# Patient Record
Sex: Male | Born: 1990 | Race: White | Hispanic: No | Marital: Single | Smoking: Never smoker
Health system: Southern US, Community
[De-identification: ages and names within clinical notes are randomized; demographics above are authoritative.]

---

## 2020-06-13 ENCOUNTER — Emergency Department (HOSPITAL_BASED_OUTPATIENT_CLINIC_OR_DEPARTMENT_OTHER)
Admission: EM | Admit: 2020-06-13 | Discharge: 2020-06-13 | Disposition: A | Discharge status: Home / Self Care | Payer: No Typology Code available for payment source | Attending: Emergency Medicine | Admitting: Emergency Medicine

## 2020-06-13 ENCOUNTER — Other Ambulatory Visit: Payer: Self-pay

## 2020-06-13 ENCOUNTER — Encounter (HOSPITAL_BASED_OUTPATIENT_CLINIC_OR_DEPARTMENT_OTHER): Payer: Self-pay

## 2020-06-13 ENCOUNTER — Emergency Department (HOSPITAL_BASED_OUTPATIENT_CLINIC_OR_DEPARTMENT_OTHER): Payer: No Typology Code available for payment source

## 2020-06-13 DIAGNOSIS — X501XXS Overexertion from prolonged static or awkward postures, sequela: Secondary | ICD-10-CM | POA: Diagnosis not present

## 2020-06-13 DIAGNOSIS — Y9301 Activity, walking, marching and hiking: Secondary | ICD-10-CM | POA: Diagnosis not present

## 2020-06-13 DIAGNOSIS — S93491S Sprain of other ligament of right ankle, sequela: Secondary | ICD-10-CM | POA: Insufficient documentation

## 2020-06-13 DIAGNOSIS — S99911S Unspecified injury of right ankle, sequela: Secondary | ICD-10-CM | POA: Diagnosis present

## 2020-06-13 DIAGNOSIS — S93401S Sprain of unspecified ligament of right ankle, sequela: Secondary | ICD-10-CM

## 2020-06-13 NOTE — ED Notes (Addendum)
States was hiking this weekend when tripped and twisted ankle inward into outer aspect of ankle.  Noted large amount of swelling and bruising to outer ankle ans top of foot.  Pedal pulse present.  Cap refill to toes less than 3 sec.  Denies numbness to toes.

## 2020-06-13 NOTE — ED Provider Notes (Signed)
He will [facility] EMERGENCY DEPT Provider Note   CSN: [id number] Arrival date & time: 06/13/20  2121     History Chief Complaint  Patient presents with  . Ankle Injury    Jonathan Li is a 30 y.o. male presenting emergency department with a right ankle injury.  The patient ports that he was hiking 3 days ago and slipped on the side of and rolled his right ankle.  He was able to bear weight lightly with support afterwards.  He reports he has had persistent swelling of the right ankle and right foot since then.  There is discoloration of the ankle and foot as well.  He has been taking Motrin for pain  HPI     History reviewed. No pertinent past medical history.  There are no problems to display for this patient.   History reviewed. No pertinent surgical history.     No family history on file.  Social History   Tobacco Use  . Smoking status: Never Smoker  . Smokeless tobacco: Never Used  Vaping Use  . Vaping Use: Never used  Substance Use Topics  . Alcohol use: Yes    Comment: Socially   . Drug use: Never    Home Medications Prior to Admission medications   Not on File    Allergies    Amoxicillin  Review of Systems   Review of Systems  Musculoskeletal: Positive for arthralgias, gait problem and myalgias.  Skin: Positive for color change and rash.  Neurological: Negative for weakness and numbness.    Physical Exam Updated Vital Signs BP (!) 145/95 (BP Location: Right Arm)   Pulse (!) 55   Temp 98.3 F (36.8 C) (Oral)   Resp 18   Ht 6\' 1"  (1.854 m)   Wt 82.3 kg   SpO2 100%   BMI 23.94 kg/m   Physical Exam Constitutional:      General: He is not in acute distress. HENT:     Head: Normocephalic and atraumatic.  Eyes:     Conjunctiva/sclera: Conjunctivae normal.     Pupils: Pupils are equal, round, and reactive to light.  Cardiovascular:     Rate and Rhythm: Normal rate and regular rhythm.  Pulmonary:     Effort: Pulmonary  effort is normal. No respiratory distress.  Musculoskeletal:     Comments: No posterior tenderness of the lateral or medial malleoli No tenderness to palpation at the base of the 5th metatarsal No tenderness of the midfoot near navicula  Patient able to bear light weight in ED, but with difficulty Ecchymosis of the right ankle and foot, with edema of the foot   Skin:    General: Skin is warm and dry.  Neurological:     General: No focal deficit present.     Mental Status: He is alert. Mental status is at baseline.     Sensory: No sensory deficit.     ED Results / Procedures / Treatments   Labs (all labs ordered are listed, but only abnormal results are displayed) Labs Reviewed - No data to display  EKG None  Radiology DG Ankle Complete Right  Result Date: 06/13/2020 CLINICAL DATA:  Twisted ankle while hiking 3 days ago. EXAM: RIGHT ANKLE - COMPLETE 3+ VIEW COMPARISON:  None. FINDINGS: Lateral soft tissue swelling. No acute bony abnormality. Specifically, no fracture, subluxation, or dislocation. IMPRESSION: No acute bony abnormality. Electronically Signed   By: 08/13/2020 M.D.   On: 06/13/2020 22:14    Procedures  Procedures   Medications Ordered in ED Medications - No data to display  ED Course  I have reviewed the triage vital signs and the nursing notes.  Pertinent labs & imaging results that were available during my care of the patient were reviewed by me and considered in my medical decision making (see chart for details).  30 yo male here with right ankle injury after rolling/inversion while hiking 3 days ago.  He is neurovascularly intact Xray films show no evident ankle fracture.  He has no midfoot tenderness to suggest lisfranc fx, no isolated 5th toe ttp to suggest jones fx.  He does have ecchymosis of the right ankle and foot suggesting there may be a ligament or muscle tear.  I cannot elicit any significant tenderness on my exam, and he maintains full ROM of  the right ankle.  Given the degree of swelling and bruising, I advised we put him in a CAM boot and that he f/u with ortho if he remains unable to bear weight in 1 week.  Advised motrin and ICE at home.     Final Clinical Impression(s) / ED Diagnoses Final diagnoses:  Sprain of right ankle, unspecified ligament, sequela    Rx / DC Orders ED Discharge Orders    None       Terald Sleeper, MD 06/14/20 1014

## 2020-06-13 NOTE — Discharge Instructions (Addendum)
You can put light weight on your right foot, but try to use the cam boot whenever walking.  If you are still having significant pain 7 days after injury, or you cannot put full weight on your foot, please make an appointment with an orthopedist.  Continue to elevate your foot at home, use ice as needed (10 minutes at a time).  The swelling should gradually go down over the next 2 weeks.  The discoloration is likely due to blood or bleeding related to a tendon or muscle tear or injury.

## 2020-06-13 NOTE — ED Triage Notes (Addendum)
Pt states backpacking over weekend hiking on an incline and twisted right ankle Sunday. Patient wearing ASO from home.

## 2020-07-10 ENCOUNTER — Encounter (HOSPITAL_BASED_OUTPATIENT_CLINIC_OR_DEPARTMENT_OTHER): Payer: Self-pay | Admitting: Nurse Practitioner

## 2020-07-10 ENCOUNTER — Other Ambulatory Visit: Payer: Self-pay

## 2020-07-10 ENCOUNTER — Ambulatory Visit (INDEPENDENT_AMBULATORY_CARE_PROVIDER_SITE_OTHER): Payer: No Typology Code available for payment source | Admitting: Nurse Practitioner

## 2020-07-10 VITALS — BP 111/75 | HR 51 | Ht 72.5 in | Wt 180.8 lb

## 2020-07-10 DIAGNOSIS — Z0000 Encounter for general adult medical examination without abnormal findings: Secondary | ICD-10-CM | POA: Insufficient documentation

## 2020-07-10 DIAGNOSIS — Z1321 Encounter for screening for nutritional disorder: Secondary | ICD-10-CM

## 2020-07-10 DIAGNOSIS — Z1329 Encounter for screening for other suspected endocrine disorder: Secondary | ICD-10-CM

## 2020-07-10 DIAGNOSIS — S93401D Sprain of unspecified ligament of right ankle, subsequent encounter: Secondary | ICD-10-CM

## 2020-07-10 DIAGNOSIS — Z13220 Encounter for screening for lipoid disorders: Secondary | ICD-10-CM

## 2020-07-10 DIAGNOSIS — Z13228 Encounter for screening for other metabolic disorders: Secondary | ICD-10-CM

## 2020-07-10 DIAGNOSIS — Z130 Encounter for screening for diseases of the blood and blood-forming organs and certain disorders involving the immune mechanism: Secondary | ICD-10-CM

## 2020-07-10 DIAGNOSIS — S93401A Sprain of unspecified ligament of right ankle, initial encounter: Secondary | ICD-10-CM | POA: Insufficient documentation

## 2020-07-10 NOTE — Assessment & Plan Note (Signed)
CPE today with labs- no abnormality noted with exception of right ankle sprain.  Review of current and past medical history, social history, medication, and family history.  Review of care gaps and health maintenance recommendations.  Records from recent providers to be requested if not available in Chart Review or Care Everywhere.  Recommendations for health maintenance, diet, and exercise provided.  UTD on recommended screenings and vaccinations Will make changes to plan of care as necessary based on lab results.  F/U in 1 year or sooner if needed

## 2020-07-10 NOTE — Assessment & Plan Note (Signed)
Appears to be healing well with no signs of re-injury, limited ROM, or weakness present.  Symptoms consistent with healing inversion sprain of severe nature.  Associated pain on plantar surface of foot to great toe indicate possible plantar fascitis. Recommend continued exercises- handout provided Wear brace for support with activity Ice for edema after activity if noted May use ibuprofen or pain and inflammation Steadily increase activity as tolerated- avoid activities causing increased pain If no improvement in 2 weeks, will consider x-ray for evaluation of possible healing stress fracture given the significance of the ecchymosis.

## 2020-07-10 NOTE — Patient Instructions (Signed)
Recommendations from today's visit: I recommend continued stretching exercises on the ankle- I would wear the brace with activity for at least another 2 weeks to help with stabilization. If it gets any worse or not any better, let us know.  Avoid walking barefoot- this can make the heel pain worse We will let you know if your labs need any further attention once they result.   Information on diet, exercise, and health maintenance recommendations are listed below. This is information to help you be sure you are on track for optimal health and monitoring.   Please look over this and let us know if you have any questions or if you have completed any of the health maintenance outside of [place] so that we can be sure your records are up to date.  ___________________________________________________________  Thank you for choosing [facility] for your Primary Care needs. I am excited for the opportunity to partner with you to meet your health care goals. It was a pleasure meeting you today!  I am an Adult-Geriatric Nurse Practitioner with a background in caring for patients for more than 20 years. I received my Paediatric nurse in Nursing and my Doctor of Nursing Practice degrees at [employer]. I received additional fellowship training in primary care and sports medicine after receiving my doctorate degree. I provide primary care and sports medicine services to patients age 92 and older within this office. I am also a provider with the [facility] and the director of the APP Fellowship with [facility].  I am a [place] native, but have called the [place] area home for nearly 20 years and am proud to be a member of this community.   I am passionate about providing the best service to you through preventive medicine and supportive care. I consider you a part of the medical team and value your input. I work diligently to  ensure that you are heard and your needs are met in a safe and effective manner. I want you to feel comfortable with me as your provider and want you to know that your health concerns are important to me.   For your information, our office hours are Monday- Friday 8:00 AM - 5:00 PM At this time I am not in the office on Wednesdays.  If you have questions or concerns, please call our office at [phone] or send [place] a MyChart message and we will respond as quickly as possible.   For all urgent or time sensitive needs we ask that you please call the office to avoid delays. MyChart is not constantly monitored and replies may take up to 72 business hours.  MyChart Policy: MyChart allows for you to see your visit notes, after visit summary, provider recommendations, lab and tests results, make an appointment, request refills, and contact your provider or the office for non-urgent questions or concerns.  Providers are seeing patients during normal business hours and do not have built in time to review MyChart messages. We ask that you allow a minimum of 72 business hours for MyChart message responses.  Complex MyChart concerns may require a visit. Your provider may request you schedule a virtual or in person visit to ensure we are providing the best care possible. MyChart messages sent after 4:00 PM on Friday will not be received by the provider until Monday morning.    Lab and Test Results: You will receive your lab and test results on  MyChart as soon as they are completed and results have been sent by the lab or testing facility. Due to this service, you will receive your results BEFORE your provider.  Please allow a minimum of 72 business hours for your provider to receive and review lab and test results and contact you about.   Most lab and test result comments from the provider will be sent through [place]. Your provider may recommend changes to the plan of care, follow-up visits, repeat testing,  ask questions, or request an office visit to discuss these results. You may reply directly to this message or call the office at [phone] to provide information for the provider or set up an appointment. In some instances, you will be called with test results and recommendations. Please let us know if this is preferred and we will make note of this in your chart to provide this for you.    If you have not heard a response to your lab or test results in 72 business hours, please call the office to let us know.   After Hours: For all non-emergency after hours needs, please call the office at [phone] and select the option to reach the on-call provider service. On-call services are shared between multiple [place] offices and therefore it will not be possible to speak directly with your provider. On-call providers may provide medical advice and recommendations, but are unable to provide refills for maintenance medications.  For all emergency or urgent medical needs after normal business hours, we recommend that you seek care at the closest Urgent Care or Emergency Department to ensure appropriate treatment in a timely manner.  [facility] [place] has a 24 hour emergency room located on the ground floor for your convenience.    Please do not hesitate to reach out to [place] with concerns.   Thank you, again, for choosing me as your health care partner. I appreciate your trust and look forward to learning more about you.   Worthy Keeler, DNP, AGNP-c ___________________________________________________________  Health Maintenance Recommendations Screening Testing Mammogram Every 1 -2 years based on history and risk factors Starting at age 80 Pap Smear Ages 21-39 every 3 years Ages 18-65 every 5 years with HPV testing More frequent testing may be required based on results and history Colon Cancer Screening Every 1-10 years based on test performed, risk factors, and  history Starting at age 75 Bone Density Screening Every 2-10 years based on history Starting at age 1 for women Recommendations for men differ based on medication usage, history, and risk factors AAA Screening One time ultrasound Men 53-72 years old who have every smoked Lung Cancer Screening Low Dose Lung CT every 12 months Age 71-80 years with a 30 pack-year smoking history who still smoke or who have quit within the last 15 years  Screening Labs Routine  Labs: Complete Blood Count (CBC), Complete Metabolic Panel (CMP), Cholesterol (Lipid Panel) Every 6-12 months based on history and medications May be recommended more frequently based on current conditions or previous results Hemoglobin A1c Lab Every 3-12 months based on history and previous results Starting at age 64 or earlier with diagnosis of diabetes, high cholesterol, BMI >26, and/or risk factors Frequent monitoring for patients with diabetes to ensure blood sugar control Thyroid Panel (TSH w/ T3 & T4) Every 6 months based on history, symptoms, and risk factors May be repeated more often if on medication HIV One time testing for all patients 64 and older May be repeated more  frequently for patients with increased risk factors or exposure Hepatitis C One time testing for all patients 18 and older May be repeated more frequently for patients with increased risk factors or exposure Gonorrhea, Chlamydia Every 12 months for all sexually active persons 13-24 years Additional monitoring may be recommended for those who are considered high risk or who have symptoms PSA Men 40-54 years old with risk factors Additional screening may be recommended from age 55-69 based on risk factors, symptoms, and history  Vaccine Recommendations Tetanus Booster All adults every 10 years Flu Vaccine All patients 6 months and older every year COVID Vaccine All patients 12 years and older Initial dosing with booster May recommend  additional booster based on age and health history HPV Vaccine 2 doses all patients age 9-26 Dosing may be considered for patients over 26 Shingles Vaccine (Shingrix) 2 doses all adults 55 years and older Pneumonia (Pneumovax 23) All adults 65 years and older May recommend earlier dosing based on health history Pneumonia (Prevnar 13) All adults 65 years and older Dosed 1 year after Pneumovax 23  Additional Screening, Testing, and Vaccinations may be recommended on an individualized basis based on family history, health history, risk factors, and/or exposure.  __________________________________________________________  Diet Recommendations for All Patients  I recommend that all patients maintain a diet low in saturated fats, carbohydrates, and cholesterol. While this can be challenging at first, it is not impossible and small changes can make big differences.  Things to try: Decreasing the amount of soda, sweet tea, and/or juice to one or less per day and replace with water While water is always the first choice, if you do not like water you may consider adding a water additive without sugar to improve the taste other sugar free drinks Replace potatoes with a brightly colored vegetable at dinner Use healthy oils, such as canola oil or olive oil, instead of butter or hard margarine Limit your bread intake to two pieces or less a day Replace regular pasta with low carb pasta options Bake, broil, or grill foods instead of frying Monitor portion sizes  Eat smaller, more frequent meals throughout the day instead of large meals  An important thing to remember is, if you love foods that are not great for your health, you don't have to give them up completely. Instead, allow these foods to be a reward when you have done well. Allowing yourself to still have special treats every once in a while is a nice way to tell yourself thank you for working hard to keep yourself healthy.   Also remember  that every day is a new day. If you have a bad day and "fall off the wagon", you can still climb right back up and keep moving along on your journey!  We have resources available to help you!  Some websites that may be helpful include: www.MyPlate.gov  Www.VeryWellFit.com _____________________________________________________________  Activity Recommendations for All Patients  I recommend that all adults get at least 20 minutes of moderate physical activity that elevates your heart rate at least 5 days out of the week.  Some examples include: Walking or jogging at a pace that allows you to carry on a conversation Cycling (stationary bike or outdoors) Water aerobics Yoga Weight lifting Dancing If physical limitations prevent you from putting stress on your joints, exercise in a pool or seated in a chair are excellent options.  Do determine your MAXIMUM heart rate for activity: YOUR AGE - 220 = MAX HeartRate     Remember! Do not push yourself too hard.  Start slowly and build up your pace, speed, weight, time in exercise, etc.  Allow your body to rest between exercise and get good sleep. You will need more water than normal when you are exerting yourself. Do not wait until you are thirsty to drink. Drink with a purpose of getting in at least 8, 8 ounce glasses of water a day plus more depending on how much you exercise and sweat.    If you begin to develop dizziness, chest pain, abdominal pain, jaw pain, shortness of breath, headache, vision changes, lightheadedness, or other concerning symptoms, stop the activity and allow your body to rest. If your symptoms are severe, seek emergency evaluation immediately. If your symptoms are concerning, but not severe, please let us know so that we can recommend further evaluation.   ________________________________________________________________  

## 2020-07-10 NOTE — Progress Notes (Signed)
BP 111/75   Pulse (!) 51   Ht 6' 0.5" (1.842 m)   Wt 180 lb 12.8 oz (82 kg)   SpO2 100%   BMI 24.18 kg/m    Subjective:    Patient ID: Jonathan Li, male    DOB: 06/28/1990, 30 y.o.   MRN: [id number]  HPI: Jonathan Li is a 30 y.o. male presenting on 07/10/2020 for comprehensive medical examination.   Current medical concerns include: right ankle sprain about 1 month ago  Jonathan Li reports that while hiking on 06/13/2020 he rolled his right ankle. He was seen in the emergency department for increased swelling and ecchymosis and it was determined that his injuries were consistent with a sprain. Today he tells me that pain, movement, and activity are much improved.  He is still experiencing some swelling and tenderness to the lateral ankle. He is also experiencing some pain in the heel and down into the great toe. He has been using the ankle regularly with work and physical activity. He is riding his bike to work on most days. He has not started running again.  He works as a Systems analystpersonal trainer and is able to do most of the movements needed for his job.  He is occasionally wearing a supportive brace especially while working and exercising.   Past Medical History:  History reviewed. No pertinent past medical history.  Medications:  No current outpatient medications on file prior to visit.   No current facility-administered medications on file prior to visit.    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   He endorses ETOH use:  about 6 drinks per week - he does report occasional concerns with drinking too much in a single occasion, but does not feel that this is an issue at this time  He denies nictoine use He  endorses illegal substance use  He is not sexually active  He denies concerns today about STI  He denies concerns about skin changes today:  He denies concerns about bowel changes today:  He denies concerns about bladder changes today:   Depression Screen done today and results listed below:  [facility] 07/10/2020  Decreased Interest 0  Down, Depressed, Hopeless 0  PHQ - 2 Score 0  Altered sleeping 3  Tired, decreased energy 1  Change in appetite 1  Feeling bad or failure about yourself  0  Trouble concentrating 0  Moving slowly or fidgety/restless 0  Suicidal thoughts 0  PHQ-9 Score 5  Difficult doing work/chores Not difficult at all   Anxiety Screen done GAD 7 : Generalized Anxiety Score 07/10/2020  Nervous, Anxious, on Edge 0  Control/stop worrying 0  Worry too much - different things 0  Trouble relaxing 0  Restless 0  Easily annoyed or irritable 0  Afraid - awful might happen 0  Total GAD 7 Score 0    The patient does not have a history of falls. I did not complete a risk assessment for falls. A plan of care for falls was documented. Fall Risk  07/10/2020  Falls in the past year? 1  Number falls in past yr: 0  Injury with Fall? 1  Risk for fall due to : No  Fall Risks  Follow up Falls evaluation completed   Surgical History:  History reviewed. No pertinent surgical history.  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    Employer: [employer]  Tobacco Use   Smoking status: Never   Smokeless tobacco: Never  Vaping Use   Vaping Use: Never used  Substance and Sexual Activity   Alcohol use: Yes    Alcohol/week: 5.0 standard drinks     Types: 5 Cans of beer per week    Comment: Socially    Drug use: Never   Sexual activity: Not Currently  Other Topics Concern   Not on file  Social History Narrative   Not on file   Social Determinants of Health   Financial Resource Strain: Not on file  Food Insecurity: Not on file  Transportation Needs: Not on file  Physical Activity: Not on file  Stress: Not on file  Social Connections: Not on file  Intimate Partner Violence: Not on file   Social History   Tobacco Use  Smoking Status Never  Smokeless Tobacco Never   Social History   Substance and Sexual Activity  Alcohol Use Yes   Alcohol/week: 5.0 standard drinks   Types: 5 Cans of beer per week   Comment: Socially     Family History:  Family History  Problem Relation Age of Onset   Hypertension Mother     Past medical history, surgical history, medications, allergies, family history and social history reviewed with patient today and changes made to appropriate areas of the chart.   All ROS negative except what is listed above and in the HPI.      Objective:    BP 111/75   Pulse (!) 51   Ht 6' 0.5" (1.842 m)   Wt 180 lb 12.8 oz (82 kg)   SpO2 100%   BMI 24.18 kg/m   Wt Readings from Last 3 Encounters:  07/10/20 180 lb 12.8 oz (82 kg)  06/13/20 181 lb 7 oz (82.3 kg)    Physical Exam Vitals and nursing note reviewed.  Constitutional:      General: He is not in acute distress.    Appearance: Normal appearance.  HENT:     Head: Normocephalic and atraumatic.     Right Ear: Tympanic membrane, ear canal and external ear normal.     Left Ear: Tympanic membrane, ear canal and external ear normal.     Nose: Nose normal.     Mouth/Throat:     Mouth: Mucous membranes are moist.     Pharynx: Oropharynx is clear.  Eyes:     Extraocular Movements: Extraocular movements intact.     Conjunctiva/sclera: Conjunctivae normal.     Pupils: Pupils are equal, round, and reactive to light.  Neck:     Vascular: No  carotid bruit.  Cardiovascular:     Rate and Rhythm: Normal rate and regular rhythm.     Pulses: Normal pulses.     Heart sounds: Normal heart sounds.  Pulmonary:     Effort: Pulmonary effort is normal.     Breath sounds: Normal breath sounds.  Abdominal:  General: Abdomen is flat. Bowel sounds are normal. There is no distension.     Palpations: Abdomen is soft.     Tenderness: There is no abdominal tenderness. There is no right CVA tenderness, left CVA tenderness, guarding or rebound.  Musculoskeletal:        General: Swelling, tenderness, deformity and signs of injury present. Normal range of motion.     Cervical back: Normal range of motion and neck supple. No tenderness.     Right lower leg: No edema.     Left lower leg: No edema.     Right foot: Deformity present.       Feet:  Feet:     Comments: Purple discoloration present to the medial and lateral portion of the right ankle consistent with healing ecchymosis.  Approximate 4cm firm nodule noted on the lateral side of the ankle, just distal and anterior to the later malleolus. Slightly tender on palpation with no fluctuation present.   ROM intact. No evidence of focal point pain.  Lymphadenopathy:     Cervical: No cervical adenopathy.  Skin:    General: Skin is warm and dry.     Capillary Refill: Capillary refill takes less than 2 seconds.     Findings: Bruising present.  Neurological:     General: No focal deficit present.     Mental Status: He is alert and oriented to person, place, and time.     Cranial Nerves: No cranial nerve deficit.     Sensory: No sensory deficit.     Motor: No weakness.     Coordination: Coordination normal.     Gait: Gait normal.  Psychiatric:        Mood and Affect: Mood normal.        Behavior: Behavior normal.        Thought Content: Thought content normal.        Judgment: Judgment normal.    No results found for this or any previous visit.    Assessment & Plan:   Problem List  Items Addressed This Visit   None    Discussed aspirin prophylaxis for myocardial infarction prevention and decision was it was not indicated  LABORATORY TESTING:  Health maintenance labs ordered today as discussed above.  - STI testing: deferred  The natural history of prostate cancer and ongoing controversy regarding screening and potential treatment outcomes of prostate cancer has been discussed with the patient. The meaning of a false positive PSA and a false negative PSA has been discussed. He indicates understanding of the limitations of this screening test and wishes not to proceed with screening PSA testing.  IMMUNIZATIONS:   - Tdap: Tetanus vaccination status reviewed: last tetanus booster within 10 years. - Influenza: Up to date - Pneumovax: Not applicable - Prevnar: Not applicable - HPV: Not applicable - Zostavax vaccine: Not applicable  SCREENING: - Colonoscopy: Not applicable  Discussed with patient purpose of the colonoscopy is to detect colon cancer at curable precancerous or Allayah Raineri stages   - AAA Screening: Not applicable  -Hearing Test: Not applicable  -Spirometry: Not applicable   PATIENT COUNSELING:   For all adult patients, I recommend A well balanced diet low in saturated fats, cholesterol, and moderation in carbohydrates.   This can be as simple as monitoring portion sizes and cutting back on sugary beverages such as soda and  juice to start with.    Daily water consumption of at least 64 ounces.  Physical activity at least 180 minutes per  week, if just starting out.   This can be as simple as taking the stairs instead of the elevator and walking 2-3 laps around the office  purposefully every day.   STD protection, partner selection, and regular testing if high risk.  Limited consumption of alcoholic beverages if alcohol is consumed.  For women, I recommend no more than 7 alcoholic beverages per week, spread out throughout the week.  Avoid "binge"  drinking or consuming large quantities of alcohol in one setting.   Please let me know if you feel you may need help with reduction or quitting alcohol consumption.   Avoidance of nicotine, if used.  Please let me know if you feel you may need help with reduction or quitting nicotine use.   Daily mental health attention.  This can be in the form of 5 minute daily meditation, prayer, journaling, yoga, reflection, etc.   Purposeful attention to your emotions and mental state can significantly improve your overall wellbeing  and  Health.  Please know that I am here to help you with all of your health care goals and am happy to work with you to find a solution that works best for you.  The greatest advice I have received with any changes in life are to take it one step at a time, that even means if all you can focus on is the next 60 seconds, then do that and celebrate your victories.  With any changes in life, you will have set backs, and that is OK. The important thing to remember is, if you have a set back, it is not a failure, it is an opportunity to try again!  Health Maintenance Recommendations Screening Testing Mammogram Every 1 -2 years based on history and risk factors Starting at age 52 Pap Smear Ages 21-39 every 3 years Ages 63-65 every 5 years with HPV testing More frequent testing may be required based on results and history Colon Cancer Screening Every 1-10 years based on test performed, risk factors, and history Starting at age 67 Bone Density Screening Every 2-10 years based on history Starting at age 68 for women Recommendations for men differ based on medication usage, history, and risk factors AAA Screening One time ultrasound Men 77-57 years old who have every smoked Lung Cancer Screening Low Dose Lung CT every 12 months Age 51-80 years with a 30 pack-year smoking history who still smoke or who have quit within the last 15 years  Screening Labs Routine  Labs:  Complete Blood Count (CBC), Complete Metabolic Panel (CMP), Cholesterol (Lipid Panel) Every 6-12 months based on history and medications May be recommended more frequently based on current conditions or previous results Hemoglobin A1c Lab Every 3-12 months based on history and previous results Starting at age 51 or earlier with diagnosis of diabetes, high cholesterol, BMI >26, and/or risk factors Frequent monitoring for patients with diabetes to ensure blood sugar control Thyroid Panel (TSH w/ T3 & T4) Every 6 months based on history, symptoms, and risk factors May be repeated more often if on medication HIV One time testing for all patients 95 and older May be repeated more frequently for patients with increased risk factors or exposure Hepatitis C One time testing for all patients 45 and older May be repeated more frequently for patients with increased risk factors or exposure Gonorrhea, Chlamydia Every 12 months for all sexually active persons 13-24 years Additional monitoring may be recommended for those who are considered high risk or who have symptoms  PSA Men 50-32 years old with risk factors Additional screening may be recommended from age 57-69 based on risk factors, symptoms, and history  Vaccine Recommendations Tetanus Booster All adults every 10 years Flu Vaccine All patients 6 months and older every year COVID Vaccine All patients 12 years and older Initial dosing with booster May recommend additional booster based on age and health history HPV Vaccine 2 doses all patients age 65-26 Dosing may be considered for patients over 26 Shingles Vaccine (Shingrix) 2 doses all adults 55 years and older Pneumonia (Pneumovax 23) All adults 65 years and older May recommend earlier dosing based on health history Pneumonia (Prevnar 7) All adults 65 years and older Dosed 1 year after Pneumovax 23  Additional Screening, Testing, and Vaccinations may be recommended on an  individualized basis based on family history, health history, risk factors, and/or exposure.    Follow up plan: NEXT PREVENTATIVE PHYSICAL DUE IN 1 YEAR. No follow-ups on file.

## 2020-07-11 ENCOUNTER — Encounter (HOSPITAL_BASED_OUTPATIENT_CLINIC_OR_DEPARTMENT_OTHER): Payer: Self-pay | Admitting: Nurse Practitioner

## 2020-07-11 LAB — CBC WITH DIFFERENTIAL/PLATELET
Basophils Absolute: 0 10*3/uL (ref 0.0–0.2)
Basos: 1 %
EOS (ABSOLUTE): 0.2 10*3/uL (ref 0.0–0.4)
Eos: 4 %
Hematocrit: 37.4 % — ABNORMAL LOW (ref 37.5–51.0)
Hemoglobin: 12.7 g/dL — ABNORMAL LOW (ref 13.0–17.7)
Immature Grans (Abs): 0 10*3/uL (ref 0.0–0.1)
Immature Granulocytes: 0 %
Lymphocytes Absolute: 1.3 10*3/uL (ref 0.7–3.1)
Lymphs: 31 %
MCH: 30.7 pg (ref 26.6–33.0)
MCHC: 34 g/dL (ref 31.5–35.7)
MCV: 90 fL (ref 79–97)
Monocytes Absolute: 0.4 10*3/uL (ref 0.1–0.9)
Monocytes: 9 %
Neutrophils Absolute: 2.3 10*3/uL (ref 1.4–7.0)
Neutrophils: 55 %
Platelets: 172 10*3/uL (ref 150–450)
RBC: 4.14 x10E6/uL (ref 4.14–5.80)
RDW: 12.2 % (ref 11.6–15.4)
WBC: 4.2 10*3/uL (ref 3.4–10.8)

## 2020-07-11 LAB — COMPREHENSIVE METABOLIC PANEL
ALT: 13 IU/L (ref 0–44)
AST: 20 IU/L (ref 0–40)
Albumin/Globulin Ratio: 1.8 (ref 1.2–2.2)
Albumin: 4.8 g/dL (ref 4.1–5.2)
Alkaline Phosphatase: 81 IU/L (ref 44–121)
BUN/Creatinine Ratio: 14 (ref 9–20)
BUN: 32 mg/dL — ABNORMAL HIGH (ref 6–20)
Bilirubin Total: 0.4 mg/dL (ref 0.0–1.2)
CO2: 25 mmol/L (ref 20–29)
Calcium: 9.5 mg/dL (ref 8.7–10.2)
Chloride: 102 mmol/L (ref 96–106)
Creatinine, Ser: 2.29 mg/dL — ABNORMAL HIGH (ref 0.76–1.27)
Globulin, Total: 2.6 g/dL (ref 1.5–4.5)
Glucose: 97 mg/dL (ref 65–99)
Potassium: 4.8 mmol/L (ref 3.5–5.2)
Sodium: 141 mmol/L (ref 134–144)
Total Protein: 7.4 g/dL (ref 6.0–8.5)
eGFR: 39 mL/min/{1.73_m2} — ABNORMAL LOW (ref >59)

## 2020-07-11 LAB — LIPID PANEL
Chol/HDL Ratio: 2.9 ratio (ref 0.0–5.0)
Cholesterol, Total: 159 mg/dL (ref 100–199)
HDL: 55 mg/dL (ref >39)
LDL Chol Calc (NIH): 90 mg/dL (ref 0–99)
Triglycerides: 74 mg/dL (ref 0–149)
VLDL Cholesterol Cal: 14 mg/dL (ref 5–40)

## 2020-07-23 ENCOUNTER — Telehealth (HOSPITAL_BASED_OUTPATIENT_CLINIC_OR_DEPARTMENT_OTHER): Payer: Self-pay

## 2020-07-23 NOTE — Telephone Encounter (Signed)
-----   Message from Tollie Eth, NP sent at 07/23/2020  7:48 AM EDT ----- Please call patient:  Hemoccult test was negative- no signs of blood loss from the GI tract.   I would like to have him come back in and have a repeat labs later this week or next week.   Please schedule nurse draw for: CBC w/ diff, Fe/TIBC/Ferritin, Erythropoietin, and Reticulocyte Index (count) added to the lab orders.  Dx code: elevated BUN/ elevated creatinine/low hgb/low hct.

## 2020-07-23 NOTE — Telephone Encounter (Signed)
Left message for patient to call back for results and recommendations. 

## 2020-07-23 NOTE — Progress Notes (Signed)
Please call patient:  Hemoccult test was negative- no signs of blood loss from the GI tract.   I would like to have him come back in and have a repeat labs later this week or next week.   Please schedule nurse draw for: CBC w/ diff, Fe/TIBC/Ferritin, Erythropoietin, and Reticulocyte Index (count) added to the lab orders.  Dx code: elevated BUN/ elevated creatinine/low hgb/low hct.

## 2020-07-23 NOTE — Telephone Encounter (Signed)
Patient called back to discus lab results and recommendations.  He understands and scheduled a nurse visit for 07/26/20 @ 10:20 am for repeat labs.

## 2020-07-26 ENCOUNTER — Ambulatory Visit (INDEPENDENT_AMBULATORY_CARE_PROVIDER_SITE_OTHER): Payer: No Typology Code available for payment source | Admitting: Nurse Practitioner

## 2020-07-26 ENCOUNTER — Other Ambulatory Visit: Payer: Self-pay

## 2020-07-26 DIAGNOSIS — R799 Abnormal finding of blood chemistry, unspecified: Secondary | ICD-10-CM

## 2020-07-26 NOTE — Progress Notes (Addendum)
   Subjective:    Patient ID: Jonathan Li, male    DOB: 1990-06-16, 30 y.o.   MRN: [id number]  HPI    Review of Systems     Objective:   Physical Exam        Assessment & Plan:  Patient presents for follow up lab results   Labs drawn by CMA- will review results and make changes to plan of care as necessary.  Bosie Helper, DNP, AGNP-c

## 2020-07-27 ENCOUNTER — Other Ambulatory Visit (HOSPITAL_BASED_OUTPATIENT_CLINIC_OR_DEPARTMENT_OTHER): Payer: Self-pay | Admitting: Nurse Practitioner

## 2020-07-27 DIAGNOSIS — D649 Anemia, unspecified: Secondary | ICD-10-CM

## 2020-07-27 LAB — CBC WITH DIFFERENTIAL/PLATELET
Basophils Absolute: 0.1 10*3/uL (ref 0.0–0.2)
Basos: 1 %
EOS (ABSOLUTE): 0.5 10*3/uL — ABNORMAL HIGH (ref 0.0–0.4)
Eos: 11 %
Hematocrit: 34.9 % — ABNORMAL LOW (ref 37.5–51.0)
Hemoglobin: 11.5 g/dL — ABNORMAL LOW (ref 13.0–17.7)
Immature Grans (Abs): 0 10*3/uL (ref 0.0–0.1)
Immature Granulocytes: 0 %
Lymphocytes Absolute: 1.5 10*3/uL (ref 0.7–3.1)
Lymphs: 36 %
MCH: 29.9 pg (ref 26.6–33.0)
MCHC: 33 g/dL (ref 31.5–35.7)
MCV: 91 fL (ref 79–97)
Monocytes Absolute: 0.3 10*3/uL (ref 0.1–0.9)
Monocytes: 8 %
Neutrophils Absolute: 1.8 10*3/uL (ref 1.4–7.0)
Neutrophils: 44 %
Platelets: 144 10*3/uL — ABNORMAL LOW (ref 150–450)
RBC: 3.85 x10E6/uL — ABNORMAL LOW (ref 4.14–5.80)
RDW: 12.2 % (ref 11.6–15.4)
WBC: 4.2 10*3/uL (ref 3.4–10.8)

## 2020-07-27 LAB — IRON,TIBC AND FERRITIN PANEL
Ferritin: 208 ng/mL (ref 30–400)
Iron Saturation: 40 % (ref 15–55)
Iron: 98 ug/dL (ref 38–169)
Total Iron Binding Capacity: 245 ug/dL — ABNORMAL LOW (ref 250–450)
UIBC: 147 ug/dL (ref 111–343)

## 2020-07-27 LAB — RETICULOCYTES: Retic Ct Pct: 0.8 % (ref 0.6–2.6)

## 2020-07-27 LAB — ERYTHROPOIETIN: Erythropoietin: 8.4 m[IU]/mL (ref 2.6–18.5)

## 2020-07-27 NOTE — Progress Notes (Signed)
Please call patient:  Studies are showing anemia is worse and your platelets (clotting factors for blood) have dropped. Your stool card was normal, with no signs of blood loss in the stool, but something is definitely going on.  I have placed a hematology referral for further evaluation and recommendations so we can find out what is going on. I am not sure if this is a production problem with the blood or if you are possibly loosing blood/destroying blood cells for some reason.  Given your age and health history- we don't want to ignore this.  I have sent the referral to the hematology office in this building.

## 2020-08-01 ENCOUNTER — Telehealth (HOSPITAL_BASED_OUTPATIENT_CLINIC_OR_DEPARTMENT_OTHER): Payer: Self-pay

## 2020-08-01 NOTE — Telephone Encounter (Signed)
-----   Message from Tollie Eth, NP sent at 07/27/2020  8:17 AM EDT ----- Please call patient:  Studies are showing anemia is worse and your platelets (clotting factors for blood) have dropped. Your stool card was normal, with no signs of blood loss in the stool, but something is definitely going on.  I have placed a hematology referral for further evaluation and recommendations so we can find out what is going on. I am not sure if this is a production problem with the blood or if you are possibly loosing blood/destroying blood cells for some reason.  Given your age and health history- we don't want to ignore this.  I have sent the referral to the hematology office in this building.

## 2020-08-01 NOTE — Telephone Encounter (Signed)
Left message for patient to call back for results and recommendations. 

## 2020-08-02 ENCOUNTER — Telehealth (HOSPITAL_BASED_OUTPATIENT_CLINIC_OR_DEPARTMENT_OTHER): Payer: Self-pay

## 2020-08-02 NOTE — Telephone Encounter (Signed)
Called patient to discuss lab results and recommendations.  Patient is aware and understands.  Instructed patient to contact the office with questions and concerns.

## 2020-08-02 NOTE — Telephone Encounter (Signed)
-----   Message from Sara E Early, NP sent at 07/27/2020  8:17 AM EDT ----- Please call patient:  Studies are showing anemia is worse and your platelets (clotting factors for blood) have dropped. Your stool card was normal, with no signs of blood loss in the stool, but something is definitely going on.  I have placed a hematology referral for further evaluation and recommendations so we can find out what is going on. I am not sure if this is a production problem with the blood or if you are possibly loosing blood/destroying blood cells for some reason.  Given your age and health history- we don't want to ignore this.  I have sent the referral to the hematology office in this building.  

## 2020-08-22 ENCOUNTER — Inpatient Hospital Stay: Payer: No Typology Code available for payment source | Attending: Oncology | Admitting: Oncology

## 2020-08-22 ENCOUNTER — Other Ambulatory Visit: Payer: Self-pay

## 2020-08-22 ENCOUNTER — Other Ambulatory Visit: Payer: Self-pay | Admitting: *Deleted

## 2020-08-22 ENCOUNTER — Inpatient Hospital Stay: Payer: No Typology Code available for payment source

## 2020-08-22 VITALS — BP 119/84 | HR 60 | Temp 97.8°F | Resp 18 | Ht 72.0 in | Wt 180.4 lb

## 2020-08-22 DIAGNOSIS — Z7289 Other problems related to lifestyle: Secondary | ICD-10-CM | POA: Diagnosis not present

## 2020-08-22 DIAGNOSIS — D649 Anemia, unspecified: Secondary | ICD-10-CM

## 2020-08-22 DIAGNOSIS — N289 Disorder of kidney and ureter, unspecified: Secondary | ICD-10-CM | POA: Diagnosis not present

## 2020-08-22 DIAGNOSIS — Z809 Family history of malignant neoplasm, unspecified: Secondary | ICD-10-CM | POA: Diagnosis not present

## 2020-08-22 DIAGNOSIS — R7989 Other specified abnormal findings of blood chemistry: Secondary | ICD-10-CM

## 2020-08-22 LAB — CMP (CANCER CENTER ONLY)
ALT: 13 U/L (ref 0–44)
AST: 20 U/L (ref 15–41)
Albumin: 4.7 g/dL (ref 3.5–5.0)
Alkaline Phosphatase: 63 U/L (ref 38–126)
Anion gap: 10 (ref 5–15)
BUN: 43 mg/dL — ABNORMAL HIGH (ref 6–20)
CO2: 28 mmol/L (ref 22–32)
Calcium: 9.3 mg/dL (ref 8.9–10.3)
Chloride: 102 mmol/L (ref 98–111)
Creatinine: 2.65 mg/dL — ABNORMAL HIGH (ref 0.61–1.24)
GFR, Estimated: 32 mL/min — ABNORMAL LOW (ref >60)
Glucose, Bld: 95 mg/dL (ref 70–99)
Potassium: 5 mmol/L (ref 3.5–5.1)
Sodium: 140 mmol/L (ref 135–145)
Total Bilirubin: 0.6 mg/dL (ref 0.3–1.2)
Total Protein: 7.2 g/dL (ref 6.5–8.1)

## 2020-08-22 LAB — CBC WITH DIFFERENTIAL (CANCER CENTER ONLY)
Abs Immature Granulocytes: 0 10*3/uL (ref 0.00–0.07)
Basophils Absolute: 0.1 10*3/uL (ref 0.0–0.1)
Basophils Relative: 1 %
Eosinophils Absolute: 0.4 10*3/uL (ref 0.0–0.5)
Eosinophils Relative: 9 %
HCT: 36.1 % — ABNORMAL LOW (ref 39.0–52.0)
Hemoglobin: 12.3 g/dL — ABNORMAL LOW (ref 13.0–17.0)
Immature Granulocytes: 0 %
Lymphocytes Relative: 36 %
Lymphs Abs: 1.6 10*3/uL (ref 0.7–4.0)
MCH: 30.4 pg (ref 26.0–34.0)
MCHC: 34.1 g/dL (ref 30.0–36.0)
MCV: 89.1 fL (ref 80.0–100.0)
Monocytes Absolute: 0.4 10*3/uL (ref 0.1–1.0)
Monocytes Relative: 9 %
Neutro Abs: 2.1 10*3/uL (ref 1.7–7.7)
Neutrophils Relative %: 45 %
Platelet Count: 158 10*3/uL (ref 150–400)
RBC: 4.05 MIL/uL — ABNORMAL LOW (ref 4.22–5.81)
RDW: 12.2 % (ref 11.5–15.5)
WBC Count: 4.6 10*3/uL (ref 4.0–10.5)
nRBC: 0 % (ref 0.0–0.2)

## 2020-08-22 LAB — SAVE SMEAR(SSMR), FOR PROVIDER SLIDE REVIEW

## 2020-08-22 NOTE — Progress Notes (Signed)
[facility] New Patient Consult   Requesting MD: Tollie Eth, [address] [place],  [place] [address]   Cordell Guercio 30 y.o.  [birth year]    Reason for Consult: Anemia   HPI: Mr. Floydene Flock was seen for a routine exam on 07/10/2020.  The hemoglobin returned mildly decreased at 12.7 with a platelet count of 172,000, and a white count of 4.2 with 55% neutrophils.  A repeat CBC on 07/26/2020 found the hemoglobin 11.5 and platelets 144,000. Additional evaluation included a ferritin of 208, and erythropoietin level of 8.4.  A chemistry panel found the BUN at 32 with a creatinine of 2.29 and a calcium of 9.5.  Albumin returned at 4.8 with a total protein of 7.4.  A stool Hemoccult was negative.  He is referred for evaluation of anemia.  He reports he had "iron deficiency "while in high school.  He had a blood count checked when he was running track.  He reports taking iron.  He had no follow-up CBC.  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  He is of [place] ancestry.  No family history of anemia  Medications: Reviewed  Allergies:  Allergies  Allergen Reactions   Penicillins    Sulfa Antibiotics Hives   Amoxicillin Rash     Social History:   He lives in [place].  He works in Chartered loss adjuster and fitness.  He does not use cigarettes.  He drinks 4 beers per day.  No heavy or alcohol use.  He can go without alcohol.  No risk factor for HIV or hepatitis.  No transfusion history.  ROS:   Positives include: Right ankle sprain while hiking 06/13/2020-improved  A complete ROS was otherwise negative.  Physical Exam:  Blood pressure 119/84, pulse 60, temperature 97.8 F (36.6 C), temperature source Oral, resp. rate 18, height 6' (1.829 m), weight 81.8 kg, SpO2 100 %.  HEENT: Neck without  mass Lungs: Clear bilaterally Cardiac: Regular rate and rhythm Abdomen: No hepatosplenomegaly GU: Testes without mass Vascular: No leg edema Lymph nodes: No cervical, supraclavicular, axillary, or inguinal nodes Neurologic: Alert and oriented, the motor exam appears intact in the upper and lower extremities bilaterally Skin: Soft mobile 1-2 cm cutaneous lesions over the arms and abdomen, no rash    LAB:  CBC  Lab Results  Component Value Date   WBC 4.6 08/22/2020   HGB 12.3 (L) 08/22/2020   HCT 36.1 (L) 08/22/2020   MCV 89.1 08/22/2020   PLT 158 08/22/2020   NEUTROABS 2.1 08/22/2020    Peripheral blood smear: The platelets appear normal in number, the white cell morphology is unremarkable.  The polychromasia is not increased.  The Red cell morphology is unremarkable.    CMP  Lab Results  Component Value Date   NA 140 08/22/2020   K 5.0 08/22/2020   CL 102 08/22/2020   CO2 28 08/22/2020   GLUCOSE 95 08/22/2020   BUN 43 (H) 08/22/2020   CREATININE 2.65 (H) 08/22/2020   CALCIUM 9.3 08/22/2020   PROT 7.2 08/22/2020   ALBUMIN 4.7 08/22/2020   AST 20 08/22/2020   ALT 13 08/22/2020   ALKPHOS 63 08/22/2020   BILITOT 0.6 08/22/2020   GFRNONAA 32 (L) 08/22/2020      Assessment/Plan:   Mild normocytic anemia  Renal insufficiency Moderate alcohol use Right ankle injury  June 2022   Disposition:   Mr Dollar has mild normocytic anemia.  He also has a significantly elevated creatinine.  The etiology of the anemia and renal failure is not apparent on review of his history, exam, and peripheral blood smear today.  We will initiate additional diagnostic evaluation to include a myeloma panel, urinalysis, ANA, CPK, and complement levels.  I discussed the case with nephrology.  He will be referred for a nephrology evaluation.  Mr Dyar will return for an office visit on 08/29/2020.  He will obtain copies of the laboratory evaluation when he was in high school in [place]. Thornton Papas, MD  08/22/2020, 3:48 PM

## 2020-08-22 NOTE — Progress Notes (Signed)
Left VM for patient that creatinine is still elevated and he needs to see renal specialist,Dr. Coladonato. Needs labs done here on 8/11 for renal MD visit. Scheduler will be calling him for a lab appointment time for tomorrow.

## 2020-08-22 NOTE — Addendum Note (Signed)
Addended by: Thornton Papas B on: 08/22/2020 08:36 PM   Modules accepted: Level of Service

## 2020-08-22 NOTE — Progress Notes (Signed)
Referral for Dr. Arrie Aran placed per Dr. Truett Perna request.

## 2020-08-23 ENCOUNTER — Encounter: Payer: Self-pay | Admitting: *Deleted

## 2020-08-23 ENCOUNTER — Inpatient Hospital Stay: Payer: No Typology Code available for payment source

## 2020-08-23 LAB — KAPPA/LAMBDA LIGHT CHAINS
Kappa free light chain: 29.7 mg/L — ABNORMAL HIGH (ref 3.3–19.4)
Kappa, lambda light chain ratio: 1.08 (ref 0.26–1.65)
Lambda free light chains: 27.6 mg/L — ABNORMAL HIGH (ref 5.7–26.3)

## 2020-08-23 NOTE — Progress Notes (Signed)
Faxed referral order, demographics and chart information to Dr. Arrie Aran [phone] for urgent referral. Noted that additional labs being drawn today will be faxed when available.

## 2020-08-27 ENCOUNTER — Other Ambulatory Visit: Payer: Self-pay

## 2020-08-27 ENCOUNTER — Inpatient Hospital Stay: Payer: No Typology Code available for payment source

## 2020-08-27 ENCOUNTER — Other Ambulatory Visit: Payer: Self-pay | Admitting: *Deleted

## 2020-08-27 DIAGNOSIS — D649 Anemia, unspecified: Secondary | ICD-10-CM | POA: Diagnosis not present

## 2020-08-27 DIAGNOSIS — R7989 Other specified abnormal findings of blood chemistry: Secondary | ICD-10-CM

## 2020-08-27 LAB — CK: Total CK: 320 U/L (ref 49–397)

## 2020-08-27 LAB — BASIC METABOLIC PANEL - CANCER CENTER ONLY
Anion gap: 8 (ref 5–15)
BUN: 43 mg/dL — ABNORMAL HIGH (ref 6–20)
CO2: 27 mmol/L (ref 22–32)
Calcium: 9.3 mg/dL (ref 8.9–10.3)
Chloride: 103 mmol/L (ref 98–111)
Creatinine: 2.28 mg/dL — ABNORMAL HIGH (ref 0.61–1.24)
GFR, Estimated: 39 mL/min — ABNORMAL LOW (ref >60)
Glucose, Bld: 91 mg/dL (ref 70–99)
Potassium: 4.9 mmol/L (ref 3.5–5.1)
Sodium: 138 mmol/L (ref 135–145)

## 2020-08-27 LAB — URINALYSIS, COMPLETE (UACMP) WITH MICROSCOPIC
Bilirubin Urine: NEGATIVE
Glucose, UA: NEGATIVE mg/dL
Hgb urine dipstick: NEGATIVE
Ketones, ur: NEGATIVE mg/dL
Leukocytes,Ua: NEGATIVE
Nitrite: NEGATIVE
Protein, ur: NEGATIVE mg/dL
Specific Gravity, Urine: 1.01 (ref 1.005–1.030)
pH: 5 (ref 5.0–8.0)

## 2020-08-28 ENCOUNTER — Telehealth: Payer: Self-pay | Admitting: *Deleted

## 2020-08-28 LAB — C3 COMPLEMENT: C3 Complement: 87 mg/dL (ref 82–167)

## 2020-08-28 LAB — C4 COMPLEMENT: Complement C4, Body Fluid: 19 mg/dL (ref 12–38)

## 2020-08-28 NOTE — Telephone Encounter (Signed)
Confirmed w/[facility] that referral received and graded as urgent/asap. Scheduler has reached out to patient and he has not called back.  Notified patient via VM and MyChart and provided phone # to call Dr. Arrie Aran 's office to schedule the appointment. Dr. Truett Perna strongly encourages this.  Faxed remaining labs except myeloma panel (pending) to renal office.

## 2020-08-29 ENCOUNTER — Inpatient Hospital Stay (HOSPITAL_BASED_OUTPATIENT_CLINIC_OR_DEPARTMENT_OTHER): Payer: No Typology Code available for payment source | Admitting: Oncology

## 2020-08-29 ENCOUNTER — Other Ambulatory Visit: Payer: Self-pay

## 2020-08-29 VITALS — BP 130/81 | HR 54 | Temp 97.9°F | Resp 18 | Ht 72.0 in | Wt 181.4 lb

## 2020-08-29 DIAGNOSIS — D649 Anemia, unspecified: Secondary | ICD-10-CM | POA: Diagnosis not present

## 2020-08-29 NOTE — Progress Notes (Signed)
  [place] OFFICE PROGRESS NOTE   Diagnosis: Anemia, renal insufficiency  INTERVAL HISTORY:   Jonathan Li returns for a scheduled visit.  He feels well.  No complaint.  He is working.  He drinks approximately 4 beers per day, sometimes less.  He sometimes has 6 beers on a weekend day.  He denies use of steroids.  No medications.  Objective:  Vital signs in last 24 hours:  Blood pressure 130/81, pulse (!) 54, temperature 97.9 F (36.6 C), temperature source Oral, resp. rate 18, height 6' (1.829 m), weight 181 lb 6.4 oz (82.3 kg), SpO2 99 %.   Physical exam-not performed today  Portacath/PICC-without erythema  Lab Results:  Lab Results  Component Value Date   WBC 4.6 08/22/2020   HGB 12.3 (L) 08/22/2020   HCT 36.1 (L) 08/22/2020   MCV 89.1 08/22/2020   PLT 158 08/22/2020   NEUTROABS 2.1 08/22/2020    CMP  Lab Results  Component Value Date   NA 138 08/27/2020   K 4.9 08/27/2020   CL 103 08/27/2020   CO2 27 08/27/2020   GLUCOSE 91 08/27/2020   BUN 43 (H) 08/27/2020   CREATININE 2.28 (H) 08/27/2020   CALCIUM 9.3 08/27/2020   PROT 7.2 08/22/2020   ALBUMIN 4.7 08/22/2020   AST 20 08/22/2020   ALT 13 08/22/2020   ALKPHOS 63 08/22/2020   BILITOT 0.6 08/22/2020   GFRNONAA 39 (L) 08/27/2020   08/27/2020: CPK 320, C3 complement-87, C4-19, kappa free light chains 29.7, lambda free light chains 27.6  08/27/2020: Urinalysis-negative for blood and white cells  Medications: I have reviewed the patient's current medications.   Assessment/Plan: Mild normocytic anemia  Renal insufficiency Moderate alcohol use Right ankle injury June 2022    Disposition: Jonathan. Jonathan Li is referred for evaluation of mild normocytic anemia.  He has renal insufficiency.  It is possible the anemia is related to renal insufficiency.  There is no apparent explanation for the renal insufficiency.  He is not hypertensive.  The urinalysis was bland, complement levels and a CPK are  normal.  The mild elevation of the kappa and lambda light chains is likely a reflection of the renal insufficiency.  I have a low clinical suspicion for multiple myeloma.  A myeloma panel is pending.  ANA is pending.  I discussed the case with Dr. Marval Regal when I saw him last week.  We will arrange a nephrology appointment while he is here today.  He will return for an office visit and CBC in 3 months.  He will try to decrease alcohol intake and be sure he is adequately hydrated.  Betsy Coder, MD  08/29/2020  12:49 PM

## 2020-08-30 LAB — MULTIPLE MYELOMA PANEL, SERUM
Albumin SerPl Elph-Mcnc: 4.6 g/dL — ABNORMAL HIGH (ref 2.9–4.4)
Albumin/Glob SerPl: 1.8 — ABNORMAL HIGH (ref 0.7–1.7)
Alpha 1: 0.2 g/dL (ref 0.0–0.4)
Alpha2 Glob SerPl Elph-Mcnc: 0.5 g/dL (ref 0.4–1.0)
B-Globulin SerPl Elph-Mcnc: 0.9 g/dL (ref 0.7–1.3)
Gamma Glob SerPl Elph-Mcnc: 1.1 g/dL (ref 0.4–1.8)
Globulin, Total: 2.7 g/dL (ref 2.2–3.9)
IgA: 205 mg/dL (ref 90–386)
IgG (Immunoglobin G), Serum: 1035 mg/dL (ref 603–1613)
IgM (Immunoglobulin M), Srm: 47 mg/dL (ref 20–172)
Total Protein ELP: 7.3 g/dL (ref 6.0–8.5)

## 2020-09-04 LAB — ANTINUCLEAR ANTIBODIES, IFA: ANA Ab, IFA: NEGATIVE

## 2020-09-13 ENCOUNTER — Other Ambulatory Visit: Payer: Self-pay | Admitting: Nephrology

## 2020-09-13 DIAGNOSIS — N1832 Chronic kidney disease, stage 3b: Secondary | ICD-10-CM

## 2020-09-14 ENCOUNTER — Ambulatory Visit
Admission: RE | Admit: 2020-09-14 | Discharge: 2020-09-14 | Disposition: A | Discharge status: Home / Self Care | Payer: No Typology Code available for payment source | Source: Ambulatory Visit | Attending: Nephrology | Admitting: Nephrology

## 2020-09-14 ENCOUNTER — Other Ambulatory Visit: Payer: Self-pay

## 2020-09-14 DIAGNOSIS — N1832 Chronic kidney disease, stage 3b: Secondary | ICD-10-CM

## 2020-10-10 ENCOUNTER — Other Ambulatory Visit (HOSPITAL_BASED_OUTPATIENT_CLINIC_OR_DEPARTMENT_OTHER): Payer: Self-pay | Admitting: Nurse Practitioner

## 2020-10-30 ENCOUNTER — Other Ambulatory Visit (HOSPITAL_BASED_OUTPATIENT_CLINIC_OR_DEPARTMENT_OTHER): Payer: Self-pay

## 2020-10-30 MED ORDER — INFLUENZA VAC SPLIT QUAD 0.5 ML IM SUSY
PREFILLED_SYRINGE | INTRAMUSCULAR | 0 refills | Status: DC
  Filled 2020-10-30: qty 0.5, 1d supply, fill #0

## 2020-11-09 ENCOUNTER — Other Ambulatory Visit: Payer: Self-pay | Admitting: Nephrology

## 2020-11-09 DIAGNOSIS — N1832 Chronic kidney disease, stage 3b: Secondary | ICD-10-CM

## 2020-11-12 ENCOUNTER — Other Ambulatory Visit (HOSPITAL_BASED_OUTPATIENT_CLINIC_OR_DEPARTMENT_OTHER): Payer: Self-pay

## 2020-11-12 MED ORDER — ALLOPURINOL 100 MG PO TABS
ORAL_TABLET | ORAL | 3 refills | Status: AC
  Filled 2020-11-12: qty 15, 30d supply, fill #0
  Filled 2021-01-30: qty 15, 30d supply, fill #1
  Filled 2021-07-26: qty 15, 30d supply, fill #2

## 2020-11-29 ENCOUNTER — Inpatient Hospital Stay: Payer: No Typology Code available for payment source

## 2020-11-29 ENCOUNTER — Encounter: Payer: Self-pay | Admitting: Nurse Practitioner

## 2020-11-29 ENCOUNTER — Inpatient Hospital Stay: Payer: No Typology Code available for payment source | Attending: Oncology | Admitting: Nurse Practitioner

## 2020-11-29 ENCOUNTER — Other Ambulatory Visit: Payer: Self-pay

## 2020-11-29 VITALS — BP 130/75 | HR 51 | Temp 97.0°F | Resp 18 | Wt 178.4 lb

## 2020-11-29 DIAGNOSIS — R7989 Other specified abnormal findings of blood chemistry: Secondary | ICD-10-CM

## 2020-11-29 DIAGNOSIS — N289 Disorder of kidney and ureter, unspecified: Secondary | ICD-10-CM | POA: Insufficient documentation

## 2020-11-29 DIAGNOSIS — D649 Anemia, unspecified: Secondary | ICD-10-CM

## 2020-11-29 DIAGNOSIS — F1090 Alcohol use, unspecified, uncomplicated: Secondary | ICD-10-CM | POA: Insufficient documentation

## 2020-11-29 DIAGNOSIS — R944 Abnormal results of kidney function studies: Secondary | ICD-10-CM | POA: Insufficient documentation

## 2020-11-29 LAB — BASIC METABOLIC PANEL - CANCER CENTER ONLY
Anion gap: 6 (ref 5–15)
BUN: 47 mg/dL — ABNORMAL HIGH (ref 6–20)
CO2: 29 mmol/L (ref 22–32)
Calcium: 9.9 mg/dL (ref 8.9–10.3)
Chloride: 98 mmol/L (ref 98–111)
Creatinine: 2.52 mg/dL — ABNORMAL HIGH (ref 0.61–1.24)
GFR, Estimated: 34 mL/min — ABNORMAL LOW (ref >60)
Glucose, Bld: 97 mg/dL (ref 70–99)
Potassium: 4.3 mmol/L (ref 3.5–5.1)
Sodium: 133 mmol/L — ABNORMAL LOW (ref 135–145)

## 2020-11-29 LAB — CBC WITH DIFFERENTIAL (CANCER CENTER ONLY)
Abs Immature Granulocytes: 0 10*3/uL (ref 0.00–0.07)
Basophils Absolute: 0 10*3/uL (ref 0.0–0.1)
Basophils Relative: 1 %
Eosinophils Absolute: 0.2 10*3/uL (ref 0.0–0.5)
Eosinophils Relative: 5 %
HCT: 38 % — ABNORMAL LOW (ref 39.0–52.0)
Hemoglobin: 13.2 g/dL (ref 13.0–17.0)
Immature Granulocytes: 0 %
Lymphocytes Relative: 38 %
Lymphs Abs: 1.4 10*3/uL (ref 0.7–4.0)
MCH: 30.2 pg (ref 26.0–34.0)
MCHC: 34.7 g/dL (ref 30.0–36.0)
MCV: 87 fL (ref 80.0–100.0)
Monocytes Absolute: 0.3 10*3/uL (ref 0.1–1.0)
Monocytes Relative: 8 %
Neutro Abs: 1.8 10*3/uL (ref 1.7–7.7)
Neutrophils Relative %: 48 %
Platelet Count: 181 10*3/uL (ref 150–400)
RBC: 4.37 MIL/uL (ref 4.22–5.81)
RDW: 11.7 % (ref 11.5–15.5)
WBC Count: 3.7 10*3/uL — ABNORMAL LOW (ref 4.0–10.5)
nRBC: 0 % (ref 0.0–0.2)

## 2020-11-29 NOTE — Progress Notes (Signed)
  [facility] OFFICE PROGRESS NOTE   Diagnosis: Anemia, renal insufficiency  INTERVAL HISTORY:   Jonathan Li returns as scheduled.  He feels well.  He remains very active.  Objective:  Vital signs in last 24 hours:  Blood pressure 130/75, pulse (!) 51, temperature (!) 97 F (36.1 C), temperature source Temporal, resp. rate 18, weight 178 lb 6.4 oz (80.9 kg), SpO2 100 %.    Resp: Lungs clear bilaterally. Cardio: Regular rate and rhythm. GI: No hepatosplenomegaly. Vascular: No leg edema.  Lab Results:  Lab Results  Component Value Date   WBC 3.7 (L) 11/29/2020   HGB 13.2 11/29/2020   HCT 38.0 (L) 11/29/2020   MCV 87.0 11/29/2020   PLT 181 11/29/2020   NEUTROABS 1.8 11/29/2020    Imaging:  No results found.  Medications: I have reviewed the patient's current medications.  Assessment/Plan: Mild normocytic anemia Renal insufficiency Moderate alcohol use Right ankle injury June 2022  Disposition:  Mr. Diesel appears stable.  Hemoglobin is in normal range today.  Creatinine remains elevated.  He continues follow-up with nephrology.  He will return for a CBC and follow-up visit in 3 months.  We are available to see him sooner if needed.    Lonna Cobb ANP/GNP-BC   11/29/2020  12:03 PM

## 2020-12-14 ENCOUNTER — Ambulatory Visit
Admission: RE | Admit: 2020-12-14 | Discharge: 2020-12-14 | Disposition: A | Discharge status: Home / Self Care | Payer: No Typology Code available for payment source | Source: Ambulatory Visit | Attending: Nephrology | Admitting: Nephrology

## 2020-12-14 DIAGNOSIS — N1832 Chronic kidney disease, stage 3b: Secondary | ICD-10-CM

## 2020-12-14 MED ORDER — GADOBENATE DIMEGLUMINE 529 MG/ML IV SOLN
15.0000 mL | Freq: Once | INTRAVENOUS | Status: AC | PRN
  Administered 2020-12-14: 15 mL via INTRAVENOUS

## 2020-12-27 ENCOUNTER — Other Ambulatory Visit (HOSPITAL_BASED_OUTPATIENT_CLINIC_OR_DEPARTMENT_OTHER): Payer: Self-pay

## 2020-12-27 MED ORDER — COVID-19 AT HOME ANTIGEN TEST VI KIT
PACK | 0 refills | Status: DC
  Filled 2020-12-27: qty 2, 2d supply, fill #0

## 2020-12-31 ENCOUNTER — Other Ambulatory Visit (HOSPITAL_BASED_OUTPATIENT_CLINIC_OR_DEPARTMENT_OTHER): Payer: Self-pay

## 2021-01-30 ENCOUNTER — Other Ambulatory Visit (HOSPITAL_BASED_OUTPATIENT_CLINIC_OR_DEPARTMENT_OTHER): Payer: Self-pay

## 2021-03-01 ENCOUNTER — Other Ambulatory Visit: Payer: Self-pay

## 2021-03-01 ENCOUNTER — Inpatient Hospital Stay: Payer: No Typology Code available for payment source | Attending: Oncology | Admitting: Oncology

## 2021-03-01 ENCOUNTER — Other Ambulatory Visit: Payer: Self-pay | Admitting: *Deleted

## 2021-03-01 ENCOUNTER — Other Ambulatory Visit (HOSPITAL_COMMUNITY): Payer: Self-pay | Admitting: Nephrology

## 2021-03-01 ENCOUNTER — Inpatient Hospital Stay: Payer: No Typology Code available for payment source

## 2021-03-01 VITALS — BP 125/74 | HR 55 | Temp 97.9°F | Resp 18 | Ht 72.0 in | Wt 189.0 lb

## 2021-03-01 DIAGNOSIS — R7989 Other specified abnormal findings of blood chemistry: Secondary | ICD-10-CM

## 2021-03-01 DIAGNOSIS — D649 Anemia, unspecified: Secondary | ICD-10-CM | POA: Insufficient documentation

## 2021-03-01 DIAGNOSIS — F1090 Alcohol use, unspecified, uncomplicated: Secondary | ICD-10-CM | POA: Diagnosis not present

## 2021-03-01 DIAGNOSIS — N1832 Chronic kidney disease, stage 3b: Secondary | ICD-10-CM

## 2021-03-01 DIAGNOSIS — N289 Disorder of kidney and ureter, unspecified: Secondary | ICD-10-CM | POA: Insufficient documentation

## 2021-03-01 LAB — BASIC METABOLIC PANEL - CANCER CENTER ONLY
Anion gap: 7 (ref 5–15)
BUN: 40 mg/dL — ABNORMAL HIGH (ref 6–20)
CO2: 28 mmol/L (ref 22–32)
Calcium: 9.3 mg/dL (ref 8.9–10.3)
Chloride: 102 mmol/L (ref 98–111)
Creatinine: 2.46 mg/dL — ABNORMAL HIGH (ref 0.61–1.24)
GFR, Estimated: 35 mL/min — ABNORMAL LOW (ref >60)
Glucose, Bld: 95 mg/dL (ref 70–99)
Potassium: 5 mmol/L (ref 3.5–5.1)
Sodium: 137 mmol/L (ref 135–145)

## 2021-03-01 LAB — CBC WITH DIFFERENTIAL (CANCER CENTER ONLY)
Abs Immature Granulocytes: 0.01 10*3/uL (ref 0.00–0.07)
Basophils Absolute: 0 10*3/uL (ref 0.0–0.1)
Basophils Relative: 1 %
Eosinophils Absolute: 0.3 10*3/uL (ref 0.0–0.5)
Eosinophils Relative: 6 %
HCT: 36.6 % — ABNORMAL LOW (ref 39.0–52.0)
Hemoglobin: 12.5 g/dL — ABNORMAL LOW (ref 13.0–17.0)
Immature Granulocytes: 0 %
Lymphocytes Relative: 33 %
Lymphs Abs: 1.4 10*3/uL (ref 0.7–4.0)
MCH: 30.5 pg (ref 26.0–34.0)
MCHC: 34.2 g/dL (ref 30.0–36.0)
MCV: 89.3 fL (ref 80.0–100.0)
Monocytes Absolute: 0.3 10*3/uL (ref 0.1–1.0)
Monocytes Relative: 8 %
Neutro Abs: 2.2 10*3/uL (ref 1.7–7.7)
Neutrophils Relative %: 52 %
Platelet Count: 160 10*3/uL (ref 150–400)
RBC: 4.1 MIL/uL — ABNORMAL LOW (ref 4.22–5.81)
RDW: 12.5 % (ref 11.5–15.5)
WBC Count: 4.2 10*3/uL (ref 4.0–10.5)
nRBC: 0 % (ref 0.0–0.2)

## 2021-03-01 LAB — URIC ACID: Uric Acid, Serum: 11.8 mg/dL — ABNORMAL HIGH (ref 3.7–8.6)

## 2021-03-01 NOTE — Progress Notes (Signed)
°  [place] OFFICE PROGRESS NOTE   Diagnosis: Anemia  INTERVAL HISTORY:   Mr Boeh returns as scheduled.  He is followed by nephrology for renal insufficiency.  He is being scheduled for a renal biopsy.  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  He reports feeling well.  No complaint.  He is exercising. Objective:  Vital signs in last 24 hours:  Blood pressure 125/74, pulse (!) 55, temperature 97.9 F (36.6 C), temperature source Oral, resp. rate 18, height 6' (1.829 m), weight 189 lb (85.7 kg), SpO2 100 %.    Lymphatics: No cervical, supraclavicular, axillary, or inguinal nodes Resp: Lungs clear bilaterally Cardio: Regular rate and rhythm GI: No hepatosplenomegaly Vascular: No leg edema   Lab Results:  Lab Results  Component Value Date   WBC 4.2 03/01/2021   HGB 12.5 (L) 03/01/2021   HCT 36.6 (L) 03/01/2021   MCV 89.3 03/01/2021   PLT 160 03/01/2021   NEUTROABS 2.2 03/01/2021    CMP  Lab Results  Component Value Date   NA 133 (L) 11/29/2020   K 4.3 11/29/2020   CL 98 11/29/2020   CO2 29 11/29/2020   GLUCOSE 97 11/29/2020   BUN 47 (H) 11/29/2020   CREATININE 2.52 (H) 11/29/2020   CALCIUM 9.9 11/29/2020   PROT 7.2 08/22/2020   ALBUMIN 4.7 08/22/2020   AST 20 08/22/2020   ALT 13 08/22/2020   ALKPHOS 63 08/22/2020   BILITOT 0.6 08/22/2020   GFRNONAA 34 (L) 11/29/2020     Medications: I have reviewed the patient's current medications.   Assessment/Plan: Mild normocytic anemia Renal insufficiency Moderate alcohol use Right ankle injury June 2022 UMOD mutation,PHKD1 variant    Disposition: Mr Tylutki appears stable.  He has stable mild normocytic anemia.  He has renal insufficiency.  The anemia is likely secondary to renal insufficiency.  He is followed by Dr. Candiss Norse for evaluation and management of renal insufficiency.  The hemoglobin has not changed significantly over the past 8 months.  We will follow-up on the  chemistry panel and uric acid levels from today.  He will be discharged from the hematology clinic.  He will continue follow-up with Dr. Candiss Norse and Marnee Spring.  I am available to see him in the future as needed.  Betsy Coder, MD  03/01/2021  11:08 AM

## 2021-03-04 ENCOUNTER — Telehealth: Payer: Self-pay

## 2021-03-04 NOTE — Telephone Encounter (Signed)
-----   Message from Ladell Pier, MD sent at 03/01/2021  4:29 PM EST ----- Please call patient, creatinine is stable, uric acid is mildly elevated, follow-up with Dr. Candiss Norse, please forward result to Dr. Candiss Norse

## 2021-03-04 NOTE — Telephone Encounter (Signed)
Pt verbalized understanding.

## 2021-03-11 ENCOUNTER — Other Ambulatory Visit: Payer: Self-pay | Admitting: Radiology

## 2021-03-12 ENCOUNTER — Ambulatory Visit (HOSPITAL_COMMUNITY)
Admission: RE | Admit: 2021-03-12 | Discharge: 2021-03-12 | Disposition: A | Discharge status: Home / Self Care | Payer: No Typology Code available for payment source | Source: Ambulatory Visit | Attending: Nephrology | Admitting: Nephrology

## 2021-03-12 ENCOUNTER — Other Ambulatory Visit: Payer: Self-pay

## 2021-03-12 ENCOUNTER — Encounter (HOSPITAL_COMMUNITY): Payer: Self-pay

## 2021-03-12 DIAGNOSIS — R7989 Other specified abnormal findings of blood chemistry: Secondary | ICD-10-CM | POA: Insufficient documentation

## 2021-03-12 DIAGNOSIS — N1832 Chronic kidney disease, stage 3b: Secondary | ICD-10-CM | POA: Diagnosis present

## 2021-03-12 DIAGNOSIS — N289 Disorder of kidney and ureter, unspecified: Secondary | ICD-10-CM | POA: Insufficient documentation

## 2021-03-12 LAB — CBC
HCT: 38.8 % — ABNORMAL LOW (ref 39.0–52.0)
Hemoglobin: 13.6 g/dL (ref 13.0–17.0)
MCH: 31.2 pg (ref 26.0–34.0)
MCHC: 35.1 g/dL (ref 30.0–36.0)
MCV: 89 fL (ref 80.0–100.0)
Platelets: 171 10*3/uL (ref 150–400)
RBC: 4.36 MIL/uL (ref 4.22–5.81)
RDW: 12.2 % (ref 11.5–15.5)
WBC: 5.5 10*3/uL (ref 4.0–10.5)
nRBC: 0 % (ref 0.0–0.2)

## 2021-03-12 LAB — PROTIME-INR
INR: 0.9 (ref 0.8–1.2)
Prothrombin Time: 12.4 seconds (ref 11.4–15.2)

## 2021-03-12 MED ORDER — LIDOCAINE HCL (PF) 1 % IJ SOLN
INTRAMUSCULAR | Status: AC
Start: 2021-03-12 — End: 2021-03-12
  Filled 2021-03-12: qty 30

## 2021-03-12 MED ORDER — MIDAZOLAM HCL 2 MG/2ML IJ SOLN
INTRAMUSCULAR | Status: AC
  Filled 2021-03-12: qty 2

## 2021-03-12 MED ORDER — FENTANYL CITRATE (PF) 100 MCG/2ML IJ SOLN
INTRAMUSCULAR | Status: AC | PRN
  Administered 2021-03-12: 50 ug via INTRAVENOUS
  Administered 2021-03-12 (×2): 25 ug via INTRAVENOUS

## 2021-03-12 MED ORDER — GELATIN ABSORBABLE 12-7 MM EX MISC
CUTANEOUS | Status: AC
Start: 2021-03-12 — End: 2021-03-12
  Filled 2021-03-12: qty 1

## 2021-03-12 MED ORDER — FENTANYL CITRATE (PF) 100 MCG/2ML IJ SOLN
INTRAMUSCULAR | Status: AC
  Filled 2021-03-12: qty 2

## 2021-03-12 MED ORDER — SODIUM CHLORIDE 0.9 % IV SOLN: INTRAVENOUS | Status: DC

## 2021-03-12 MED ORDER — MIDAZOLAM HCL 2 MG/2ML IJ SOLN
INTRAMUSCULAR | Status: AC | PRN
  Administered 2021-03-12: .5 mg via INTRAVENOUS
  Administered 2021-03-12: 1 mg via INTRAVENOUS
  Administered 2021-03-12: .5 mg via INTRAVENOUS

## 2021-03-12 NOTE — H&P (Signed)
Chief Complaint: Patient was seen in consultation today for random renal biopsy at the request of Singh,Vikas  Referring Physician(s): Singh,Vikas  Supervising Physician: Mir, Mauri Reading  Patient Status: [facility] - Out-pt  History of Present Illness: Jonathan Li is a 31 y.o. 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 No pertinent past medical history.  History reviewed. No pertinent surgical history.  Allergies: Penicillins, Sulfa antibiotics, and Amoxicillin  Medications: Prior to Admission medications   Medication Sig Start Date End Date Taking? Authorizing Provider  allopurinol (ZYLOPRIM) 100 MG tablet Take 1/2 tablet by mouth daily Patient taking differently: Take 50 mg by mouth daily. 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    Employer: [employer]  Tobacco Use   Smoking status: Never   Smokeless tobacco: Never  Vaping Use   Vaping Use: Never used  Substance and Sexual Activity   Alcohol use: Yes    Alcohol/week: 5.0 standard drinks    Types: 5 Cans of beer per week    Comment: Socially    Drug use: Never   Sexual activity: Not Currently  Other Topics Concern   Not on file  Social History Narrative   Not on file   Social Determinants of Health   Financial Resource Strain: Not on file  Food Insecurity: Not on file  Transportation Needs: Not on file  Physical Activity: Not on file  Stress: Not on file  Social Connections: Not on file     Review of Systems: A 12 point ROS discussed and  pertinent positives are indicated in the HPI above.  All other systems are negative.  Review of Systems  Constitutional:  Negative for activity change, fatigue and fever.  Respiratory:  Negative for cough and shortness of breath.   Cardiovascular:  Negative for chest pain.  Gastrointestinal:  Negative for abdominal pain, nausea and vomiting.  Psychiatric/Behavioral:  Negative for behavioral problems and confusion.    Vital Signs: BP 125/82    Pulse 64    Temp (!) 97.4 F (36.3 C) (Oral)    Resp 16    Ht 6\' 1"  (1.854 m)    Wt 190 lb (86.2 kg)    SpO2 99%    BMI 25.07 kg/m   Physical Exam Vitals reviewed.  HENT:     Mouth/Throat:     Mouth: Mucous membranes are moist.  Cardiovascular:     Rate and Rhythm: Normal rate and regular rhythm.     Heart sounds: Normal heart sounds.  Pulmonary:     Effort: Pulmonary effort is normal.     Breath sounds: Normal breath sounds.  Abdominal:     Palpations: Abdomen is soft.  Musculoskeletal:        General: Normal range of motion.     Right lower leg: No edema.     Left lower leg: No edema.  Skin:    General: Skin is warm.  Neurological:     Mental Status:  He is alert and oriented to person, place, and time.  Psychiatric:        Behavior: Behavior normal.    Imaging: No results found.  Labs:  CBC: Recent Labs    08/22/20 1024 11/29/20 1128 03/01/21 1051 03/12/21 0635  WBC 4.6 3.7* 4.2 5.5  HGB 12.3* 13.2 12.5* 13.6  HCT 36.1* 38.0* 36.6* 38.8*  PLT 158 181 160 171    COAGS: No results for input(s): INR, APTT in the last 8760 hours.  BMP: Recent Labs    08/22/20 1024 08/27/20 1343 11/29/20 1128 03/01/21 1051  NA 140 138 133* 137  K 5.0 4.9 4.3 5.0  CL 102 103 98 102  CO2 28 27 29 28   GLUCOSE 95 91 97 95  BUN 43* 43* 47* 40*  CALCIUM 9.3 9.3 9.9 9.3  CREATININE 2.65* 2.28* 2.52* 2.46*  GFRNONAA 32* 39* 34* 35*    LIVER FUNCTION TESTS: Recent Labs    07/10/20 1406 08/22/20 1024  BILITOT 0.4 0.6  AST 20  20  ALT 13 13  ALKPHOS 81 63  PROT 7.4 7.2  ALBUMIN 4.8 4.7    TUMOR MARKERS: No results for input(s): AFPTM, CEA, CA199, CHROMGRNA in the last 8760 hours.  Assessment and Plan:  CKD3b Creatinine elevation Random renal biopsy per Nephrology request Risks and benefits of random renal biopsy was discussed with the patient and/or patient's family including, but not limited to bleeding, infection, damage to adjacent structures or low yield requiring additional tests.  All of the questions were answered and there is agreement to proceed.  Consent signed and in chart.    Thank you for this interesting consult.  I greatly enjoyed meeting Ephriam Turman and look forward to participating in their care.  A copy of this report was sent to the requesting provider on this date.  Electronically Signed: Shanda Bumps, PA-C 03/12/2021, 6:58 AM   I spent a total of  30 Minutes   in face to face in clinical consultation, greater than 50% of which was counseling/coordinating care for random renal biopsy

## 2021-03-12 NOTE — Procedures (Signed)
Interventional Radiology Procedure Note  Procedure: US guided random renal biopsy  Indication: CKD  Findings: Please refer to procedural dictation for full description.  Complications: None  EBL: < 10 mL  Daiana Vitiello, MD [phone]   

## 2021-03-15 ENCOUNTER — Encounter (HOSPITAL_COMMUNITY): Payer: Self-pay

## 2021-03-15 LAB — SURGICAL PATHOLOGY

## 2021-07-26 ENCOUNTER — Other Ambulatory Visit (HOSPITAL_BASED_OUTPATIENT_CLINIC_OR_DEPARTMENT_OTHER): Payer: Self-pay

## 2021-10-29 ENCOUNTER — Other Ambulatory Visit (HOSPITAL_COMMUNITY): Payer: Self-pay

## 2021-11-11 ENCOUNTER — Other Ambulatory Visit (HOSPITAL_BASED_OUTPATIENT_CLINIC_OR_DEPARTMENT_OTHER): Payer: Self-pay

## 2021-11-11 MED ORDER — INFLUENZA VAC SPLIT QUAD 0.5 ML IM SUSY
PREFILLED_SYRINGE | INTRAMUSCULAR | 0 refills | Status: AC
  Filled 2021-11-11: qty 0.5, 1d supply, fill #0

## 2021-12-03 ENCOUNTER — Other Ambulatory Visit (HOSPITAL_BASED_OUTPATIENT_CLINIC_OR_DEPARTMENT_OTHER): Payer: Self-pay

## 2021-12-03 MED ORDER — ALLOPURINOL 100 MG PO TABS
100.0000 mg | ORAL_TABLET | Freq: Every day | ORAL | 5 refills | Status: AC
  Filled 2021-12-03: qty 30, 30d supply, fill #0

## 2021-12-10 ENCOUNTER — Other Ambulatory Visit (HOSPITAL_BASED_OUTPATIENT_CLINIC_OR_DEPARTMENT_OTHER): Payer: Self-pay

## 2021-12-18 ENCOUNTER — Other Ambulatory Visit (HOSPITAL_BASED_OUTPATIENT_CLINIC_OR_DEPARTMENT_OTHER): Payer: Self-pay

## 2022-01-22 ENCOUNTER — Other Ambulatory Visit (HOSPITAL_BASED_OUTPATIENT_CLINIC_OR_DEPARTMENT_OTHER): Payer: Self-pay

## 2022-01-22 ENCOUNTER — Encounter: Payer: Self-pay | Admitting: Internal Medicine

## 2022-01-26 DIAGNOSIS — S63268A Dislocation of metacarpophalangeal joint of other finger, initial encounter: Secondary | ICD-10-CM | POA: Diagnosis not present

## 2022-01-26 DIAGNOSIS — S63261A Dislocation of metacarpophalangeal joint of left index finger, initial encounter: Secondary | ICD-10-CM | POA: Diagnosis not present

## 2022-01-27 DIAGNOSIS — R55 Syncope and collapse: Secondary | ICD-10-CM | POA: Diagnosis not present

## 2022-01-28 ENCOUNTER — Other Ambulatory Visit (HOSPITAL_BASED_OUTPATIENT_CLINIC_OR_DEPARTMENT_OTHER): Payer: Self-pay

## 2022-01-28 MED ORDER — ONDANSETRON HCL 4 MG PO TABS
ORAL_TABLET | ORAL | 0 refills | Status: AC
  Filled 2022-01-28: qty 20, 7d supply, fill #0

## 2022-01-28 MED ORDER — POLYETHYLENE GLYCOL 3350 17 GM/SCOOP PO POWD
ORAL | 0 refills | Status: AC
  Filled 2022-01-28: qty 238, 7d supply, fill #0

## 2022-01-28 MED ORDER — TRAMADOL HCL 50 MG PO TABS
50.0000 mg | ORAL_TABLET | Freq: Four times a day (QID) | ORAL | 0 refills | Status: AC | PRN
  Filled 2022-01-28: qty 25, 5d supply, fill #0

## 2022-09-10 NOTE — Progress Notes (Deleted)
Subjective:   Jonathan Li [facility] May 10, 1990 09/11/2022  CC: No chief complaint on file.   HPI: Jonathan Li is a 32 y.o. male who presents for a routine health maintenance exam.  Labs collected at time of visit.   HEALTH SCREENINGS: - Vision Screening: {Blank single:19197::"pap done","not applicable","up to date","done elsewhere"} - Dental Visits: {Blank single:19197::"pap done","not applicable","up to date","done elsewhere"} - Testicular Exam: {Blank single:19197::"Up to date","Ordered today","Not applicable","Declined","Done elsewhere"} - STD Screening: {Blank single:19197::"Up to date","Ordered today","Not applicable","Declined","Done elsewhere"} - PSA (50+): {Blank single:19197::"Up to date","Ordered today","Not applicable","Declined","Done elsewhere"}  No results found for: "PSA1", "PSA"   - Colonoscopy (45+): {Blank single:19197::"Up to date","Ordered today","Not applicable","Declined","Done elsewhere"}  Discussed with patient purpose of the colonoscopy is to detect colon cancer at curable precancerous or early stages  - AAA Screening: {Blank single:19197::"Up to date","Ordered today","Not applicable","Declined","Done elsewhere"}  Men age 36-75 who have ever smoked - Lung Cancer screening with low-dose CT: {Blank single:19197::"Up to date","Ordered today","Not applicable","Declined","Done elsewhere"}-  Adults age 5-80 who are current cigarette smokers or quit within the last 15 years. Must have 20 pack year history.   Depression and Anxiety Screen done today and results listed below:     07/10/2020    1:28 PM  Depression screen PHQ 2/9  Decreased Interest 0  Down, Depressed, Hopeless 0  PHQ - 2 Score 0  Altered sleeping 3  Tired, decreased energy 1  Change in appetite 1  Feeling bad or failure about yourself  0  Trouble concentrating 0  Moving slowly or fidgety/restless 0  Suicidal thoughts 0  PHQ-9 Score 5  Difficult doing work/chores Not difficult at all       07/10/2020    1:28 PM  GAD 7 : Generalized Anxiety Score  Nervous, Anxious, on Edge 0  Control/stop worrying 0  Worry too much - different things 0  Trouble relaxing 0  Restless 0  Easily annoyed or irritable 0  Afraid - awful might happen 0  Total GAD 7 Score 0    IMMUNIZATIONS:  - Tdap: Tetanus vaccination status reviewed: {tetanus status:315746}. - Influenza: {Blank single:19197::"Up to date","Administered today","Postponed to flu season","Refused","Given elsewhere"} - Pneumovax: {Blank single:19197::"Up to date","Administered today","Not applicable","Refused","Given elsewhere"} - Prevnar: {Blank single:19197::"Up to date","Administered today","Not applicable","Refused","Given elsewhere"} - Zostavax vaccine (50+): {Blank single:19197::"Up to date","Administered today","Not applicable","Refused","Given elsewhere"}   Past medical history, surgical history, medications, allergies, family history and social history reviewed with patient today and changes made to appropriate areas of the chart.   No past medical history on file.  No past surgical history on file.  Current Outpatient Medications on File Prior to Visit  Medication Sig   allopurinol (ZYLOPRIM) 100 MG tablet Take 1/2 tablet by mouth daily (Patient taking differently: Take 50 mg by mouth daily.)   allopurinol (ZYLOPRIM) 100 MG tablet Take 1 tablet (100 mg total) by mouth daily.   influenza vac split quadrivalent PF (FLUARIX) 0.5 ML injection Inject into the muscle.   ondansetron (ZOFRAN) 4 MG tablet Take 1 tablet (4 mg total) by mouth every 8 (eight) hours as needed for Nausea / Vomiting.   polyethylene glycol powder (GLYCOLAX/MIRALAX) 17 GM/SCOOP powder Take 17 g by mouth daily for 7 days.   traMADol (ULTRAM) 50 MG tablet Take 1 tablet (50 mg total) by mouth every 6 (six) hours as needed for up to 5 days after surgery.   No current facility-administered medications on file prior to visit.    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    Employer: [employer]  Tobacco Use   Smoking status: Never   Smokeless tobacco: Never  Vaping Use   Vaping status: Never Used  Substance and Sexual Activity   Alcohol use: Yes    Alcohol/week: 5.0 standard drinks of alcohol    Types: 5 Cans of beer per week    Comment: Socially    Drug use: Never   Sexual activity: Not Currently  Other Topics Concern   Not on file  Social History Narrative   Not on file   Social Determinants of Health   Financial Resource Strain: Not on file  Food Insecurity: Not on file  Transportation Needs: Not on file  Physical Activity: Not on file  Stress: Not on file  Social Connections: Not on file  Intimate Partner Violence: Not on file   Social History   Tobacco Use  Smoking Status Never  Smokeless Tobacco Never   Social History   Substance and Sexual Activity  Alcohol Use Yes   Alcohol/week: 5.0 standard drinks of alcohol   Types: 5 Cans of beer per week   Comment: Socially      Family History  Problem Relation Age of Onset   Hypertension Mother      ROS: Denies fever, fatigue, unexplained weight loss/gain, CP, SHOB, and palpatitations. Denies neurological deficits, gastrointestinal and/or genitourinary complaints, and skin changes.   Objective:   There were no vitals filed for this visit.  GENERAL APPEARANCE: Well-appearing, in NAD. Well nourished.  SKIN: Pink, warm and dry. Turgor normal. No rash, lesion, ulceration, or ecchymoses. Hair evenly distributed.  HEENT: HEAD: Normocephalic.  EYES: PERRLA. EOMI. Lids intact w/o defect. Sclera white,  Conjunctiva pink w/o exudate.  EARS: External ear w/o redness, swelling, masses or lesions. EAC clear. TM's intact, translucent w/o bulging, appropriate landmarks visualized. Appropriate acuity to conversational tones.  NOSE: Septum midline w/o deformity. Nares patent, mucosa pink and non-inflamed w/o drainage. No sinus tenderness.  THROAT: Uvula midline. Oropharynx clear. Tonsils non-inflamed w/o exudate. Oral mucosa pink and moist.  NECK: Supple, Trachea midline. Full ROM w/o pain or tenderness. No lymphadenopathy. Thyroid non-tender w/o enlargement or palpable masses.  RESPIRATORY: Chest wall symmetrical w/o masses. Respirations even and non-labored. Breath sounds clear to auscultation bilaterally. No wheezes, rales, rhonchi, or crackles. CARDIAC: S1, S2 present, regular rate and rhythm. No gallops, murmurs, rubs, or clicks. PMI w/o lifts, heaves, or thrills. No carotid bruits. Capillary refill <2 seconds. Peripheral pulses 2+ bilaterally. GI: Abdomen soft w/o distention. Normoactive bowel sounds. No palpable masses or tenderness. No guarding or rebound tenderness. Liver and spleen w/o tenderness or enlargement. No CVA tenderness.  GU: *** deferred exam. MSK: Muscle tone and strength appropriate for age, w/o atrophy or abnormal movement. EXTREMITIES: Active ROM intact, w/o tenderness, crepitus, or contracture. No obvious joint deformities or effusions. No clubbing, edema, or cyanosis.  NEUROLOGIC: CN's II-XII intact. Motor strength symmetrical with no obvious weakness. No sensory deficits. DTR 2+ symmetric bilaterally. Steady, even gait.  PSYCH/MENTAL STATUS: Alert, oriented x 3. Cooperative, appropriate mood and affect.   Results for orders placed or  performed during the hospital encounter of 03/12/21  CBC upon arrival  Result Value Ref Range   WBC 5.5 4.0 - 10.5 K/uL   RBC 4.36 4.22 - 5.81 MIL/uL   Hemoglobin 13.6 13.0 - 17.0 g/dL   HCT 60.4 (L) 54.0 - 98.1 %   MCV 89.0 80.0 - 100.0 fL    MCH 31.2 26.0 - 34.0 pg   MCHC 35.1 30.0 - 36.0 g/dL   RDW 19.1 47.8 - 29.5 %   Platelets 171 150 - 400 K/uL   nRBC 0.0 0.0 - 0.2 %  Protime-INR upon arrival  Result Value Ref Range   Prothrombin Time 12.4 11.4 - 15.2 seconds   INR 0.9 0.8 - 1.2  Surgical pathology  Result Value Ref Range   SURGICAL PATHOLOGY      SURGICAL PATHOLOGY CASE: [id number] PATIENT: Burnice Logan Surgical Pathology Report     Clinical History: chronic ki8dney disease (cm)   FINAL MICROSCOPIC DIAGNOSIS:  A. KIDNEY, RIGHT, NEEDLE CORE BIOPSY: - See OUTSIDE PATHOLOGY REPORT under RESULTS REVIEW or the LABS TAB under "[facility], Nephropathology, Kidney Biopsy Send out" in [facility].  GROSS DESCRIPTION:  A.  Received in saline labeled with the patient's name and DOB are three cores of red-tan soft tissue ranging from 0.4-1.1 cm in length and each 0.1 cm in diameter. Two cores are placed in formalin and the other is placed in Michel's transport medial and sent to [facility] for analysis.  (LEF 03/12/2021)    Final Diagnosis performed by Manning Charity, MD.   Electronically signed 03/15/2021 Technical and / or Professional components performed at [facility]. [facility], [address], [place], [place] [address].  Immunohistochemistry Technical component (if applicable) was performed at [facility]. [address], [place], [place] [address].   IMMUNOHISTOCHEMISTRY DISCLAIMER (if applicable): Some of these immunohistochemical stains may have been developed and the performance characteristics determine by [facility]. Some may not have been cleared or approved by the U.S. Food and Drug Administration. The FDA has determined that such clearance or approval is not necessary. This test is used for clinical purposes. It should not be regarded as investigational or for research. This laboratory is certified under the Clinical Laboratory Improvement  Amendments of 1988 (CLIA-88) as qualified to perform high complexity clinical laboratory testing.  The controls stained appropriately.     Assessment & Plan:  ***   No orders of the defined types were placed in this encounter.   PATIENT COUNSELING: - Encouraged to adjust caloric intake to maintain or achieve ideal body weight, to reduce intake of dietary saturated fat and total fat, to limit sodium intake by avoiding high sodium foods and not adding table salt, and to maintain adequate dietary potassium and calcium preferably from fresh fruits, vegetables, and low-fat dairy products.   - Advised to avoid cigarette smoking. - Discussed with the patient that most people either abstain from alcohol or drink within safe limits (<=14/week and <=4 drinks/occasion for males, <=7/weeks and <= 3 drinks/occasion for females) and that the risk for alcohol disorders and other health effects rises proportionally with the number of drinks per week and how often a drinker exceeds daily limits. - Discussed cessation/primary prevention of drug use and availability of treatment for abuse.   - Stressed the importance of regular exercise - Injury prevention: Discussed safety belts, safety helmets, smoke detector, smoking near bedding or upholstery.  - Dental health: Discussed importance of regular tooth brushing,  flossing, and dental visits.  - Sexuality: Discussed sexually transmitted diseases, partner selection, use of condoms, avoidance of unintended pregnancy  and contraceptive alternatives.   NEXT PREVENTATIVE PHYSICAL DUE IN 1 YEAR.  No follow-ups on file.  Patient to reach out to office if new, worrisome, or unresolved symptoms arise or if no improvement in patient's condition. Patient verbalized understanding and is agreeable to treatment plan. All questions answered to patient's satisfaction.    Yolanda Manges, FNP

## 2022-09-11 ENCOUNTER — Ambulatory Visit (HOSPITAL_BASED_OUTPATIENT_CLINIC_OR_DEPARTMENT_OTHER): Payer: 59 | Admitting: Family Medicine

## 2022-09-11 ENCOUNTER — Encounter (HOSPITAL_BASED_OUTPATIENT_CLINIC_OR_DEPARTMENT_OTHER): Payer: Self-pay

## 2022-10-23 ENCOUNTER — Other Ambulatory Visit (HOSPITAL_BASED_OUTPATIENT_CLINIC_OR_DEPARTMENT_OTHER): Payer: Self-pay

## 2022-10-23 MED ORDER — FLULAVAL 0.5 ML IM SUSY
PREFILLED_SYRINGE | INTRAMUSCULAR | 0 refills | Status: AC
  Filled 2022-10-23: qty 0.5, 1d supply, fill #0

## 2022-12-22 IMAGING — MR MR ABDOMEN WO/W CM
11 of 17 series · 29 of 48 positions shown · IV contrast (multihance)
Comparison: Ultrasound September 14, 2020.

CLINICAL DATA: Further characterization of left renal cyst seen on
prior ultrasound

EXAM:
MRI ABDOMEN WITHOUT AND WITH CONTRAST
TECHNIQUE: Multiplanar multisequence MR imaging of the abdomen was performed
both before and after the administration of intravenous contrast.
CONTRAST:  15mL MULTIHANCE GADOBENATE DIMEGLUMINE 529 MG/ML IV SOLN

[Series 3: cor haste · coronal · 5.0mm · 0.68mm/px · 2 of 32 slices shown]
[im 1/32]
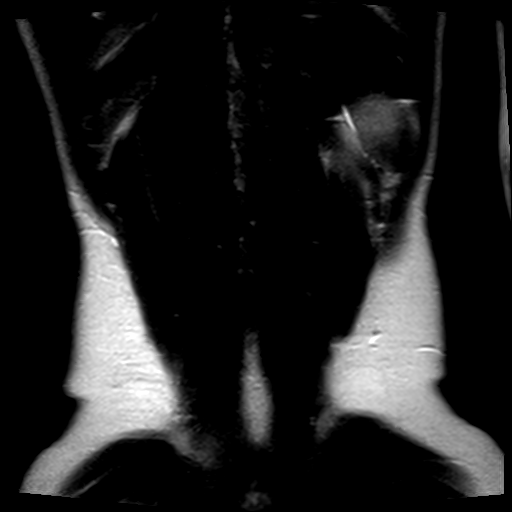
[im 32/32]
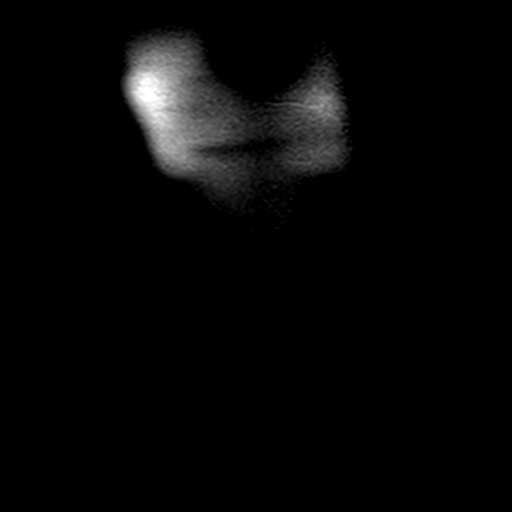

[Series 4: axial haste · axial · 6.0mm · 0.68mm/px · z∈[-81,+131]mm · 2 of 33 slices shown]
[im 1/33]
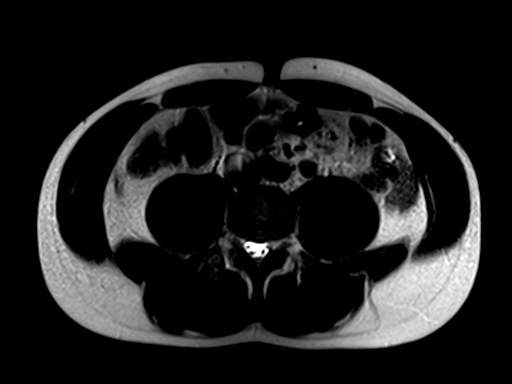
[im 33/33]
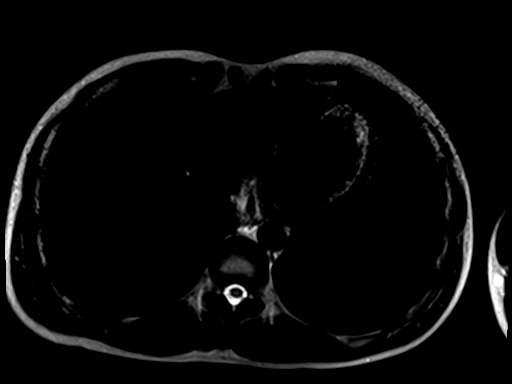

[Series 5: T1 · axial · 6.0mm · 0.68mm/px · z∈[-87,+137]mm · 4 of 70 slices shown]
[im 1/70]
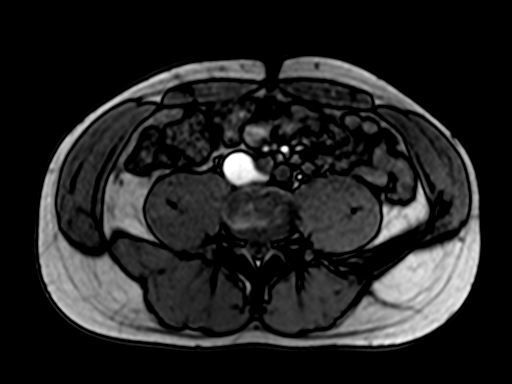
[im 24/70]
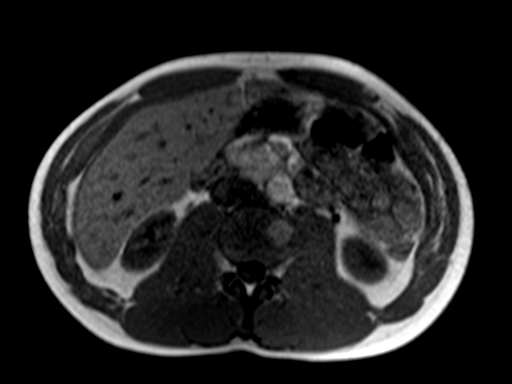
[im 47/70]
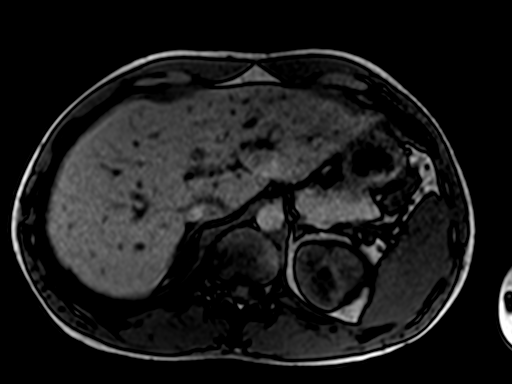
[im 70/70]
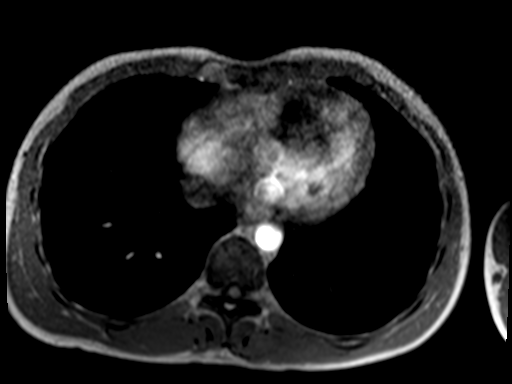

[Series 6: bSSFP · axial · 4.0mm · 0.68mm/px · z∈[-95,+145]mm · 3 of 61 slices shown]
[im 1/61]
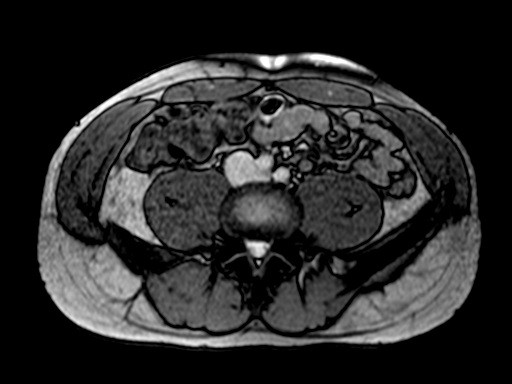
[im 31/61]
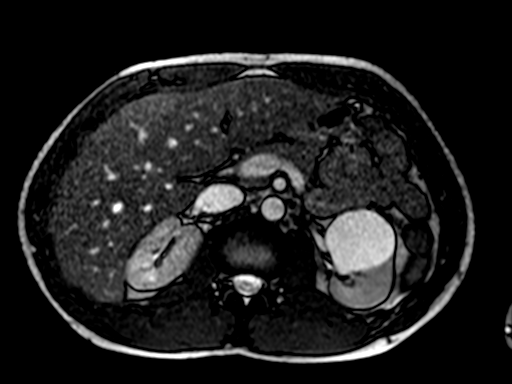
[im 61/61]
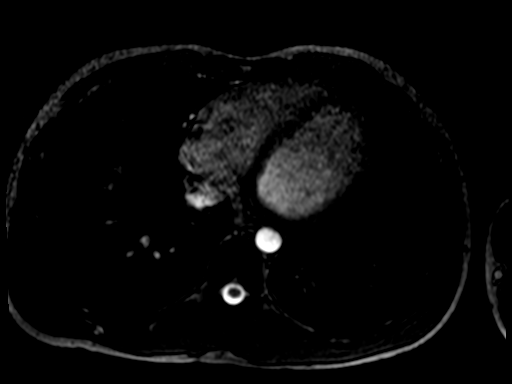

[Series 7: T2 fat-sat · axial · 6.0mm · 1.09mm/px · z∈[-90,+133]mm · 2 of 32 slices shown]
[im 1/32]
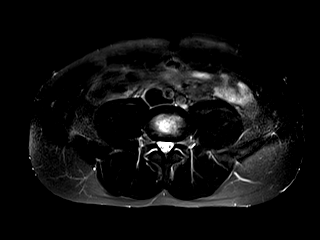
[im 32/32]
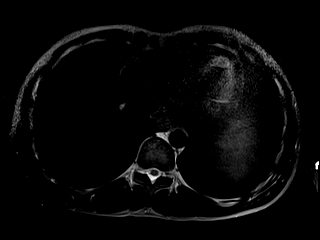

[Series 8: ep2d_diff_b50_500_800_p2_trig · axial · 6.0mm · 1.82mm/px · z∈[-92,+160]mm · 4 of 108 slices shown]
[im 1/108]
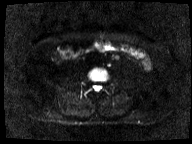
[im 36/108]
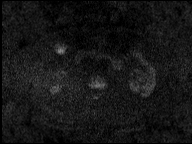
[im 72/108]
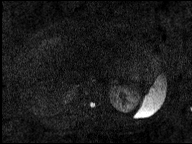
[im 108/108]
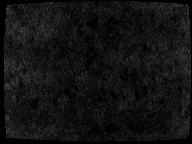

[Series 9: ep2d_diff_b50_500_800_p2_trig_adc · axial · 6.0mm · 1.82mm/px · 1 of 36 slices shown]
[im 1/36]
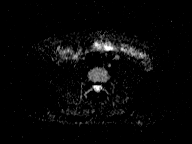

[Series 10: T1 dynamic · axial · non-contrast · 2.5mm · 0.74mm/px · z∈[-92,+125]mm · 3 of 88 slices shown]
[im 1/88]
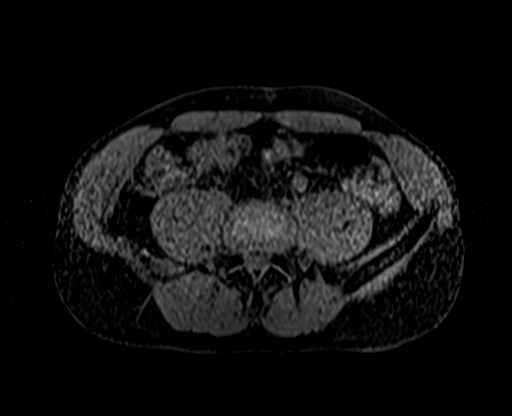
[im 44/88]
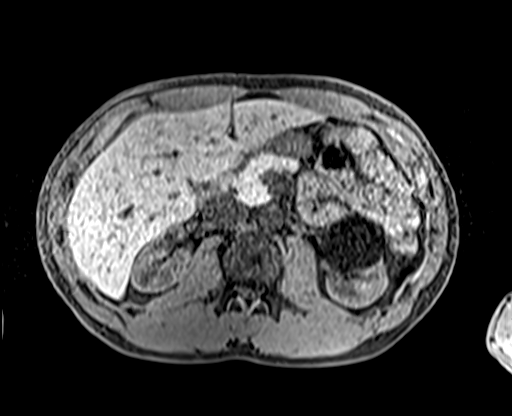
[im 88/88]
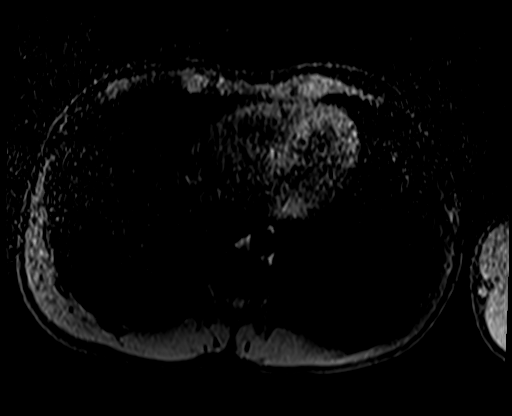

[Series 11: T1 dynamic post-contrast · axial · 2.5mm · 0.74mm/px · z∈[-92,+125]mm · 3 of 88 slices shown (1 of 3)]
[im 1/88]
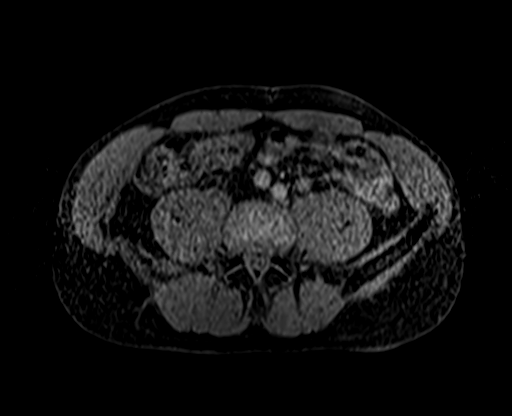
[im 44/88]
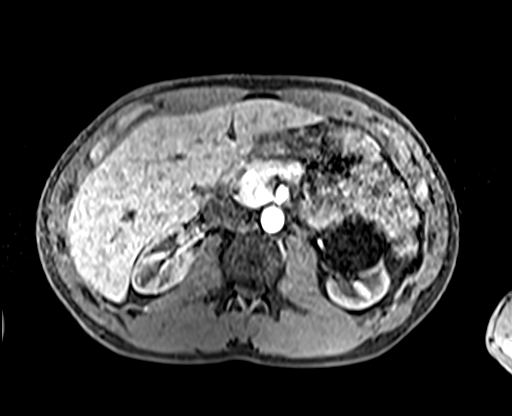
[im 88/88]
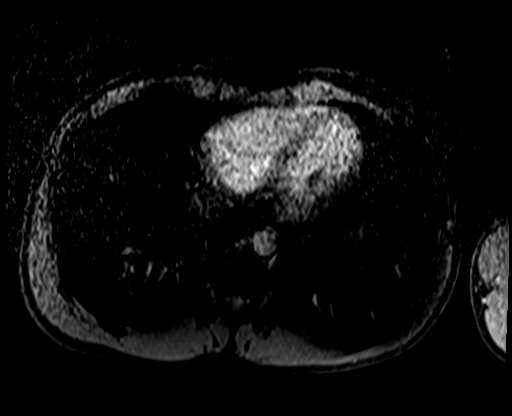

[Series 12: T1 dynamic post-contrast · axial · 2.5mm · 0.74mm/px · z∈[-92,+125]mm · 3 of 88 slices shown (2 of 3)]
[im 1/88]
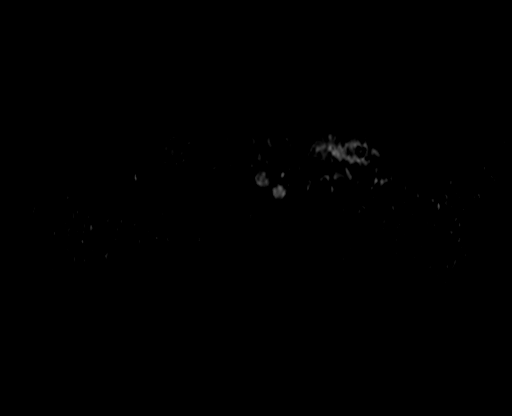
[im 44/88]
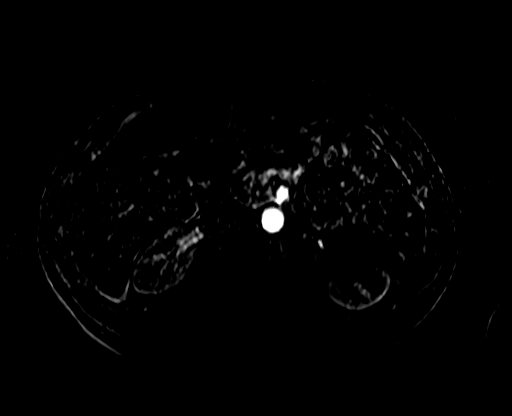
[im 88/88]
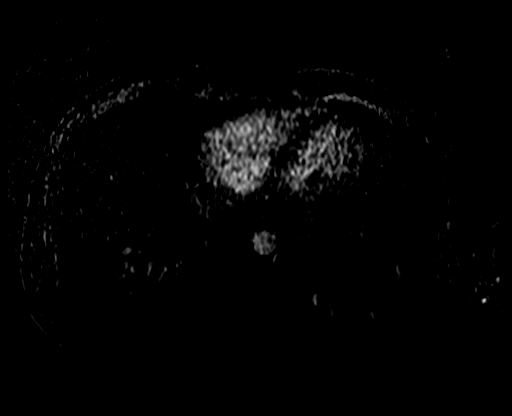

[Series 13: T1 dynamic post-contrast · axial · 2.5mm · 0.74mm/px · z∈[-92,+15]mm · 2 of 88 slices shown (3 of 3)]
[im 1/88]
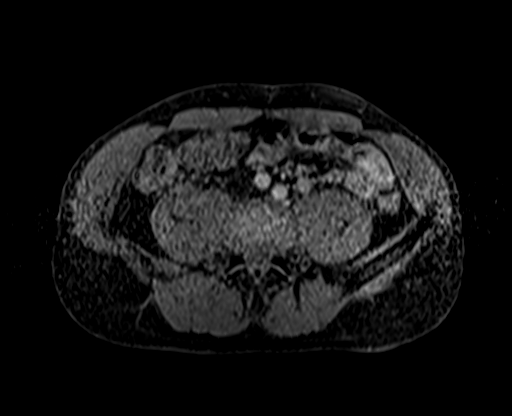
[im 44/88]
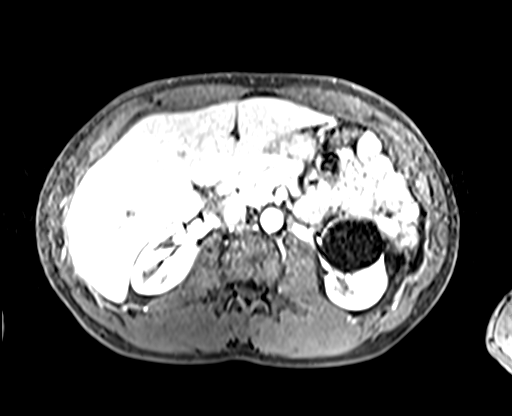

[29 of 48 positions shown; findings below may reference images not displayed]

FINDINGS: Lower chest: No acute abnormality.

Hepatobiliary: No hepatic steatosis. No suspicious hepatic lesion.
Gallbladder is unremarkable. No biliary ductal dilation.

Pancreas: Intrinsic T1 signal of the pancreatic parenchyma is within
normal limits. No pancreatic ductal dilation. No cystic or solid
hyperenhancing pancreatic mass visualized.

Spleen:  Within normal limits.

Adrenals/Urinary Tract:  Bilateral adrenal glands are unremarkable.

No hydronephrosis. Right kidneys unremarkable. 4.7 cm left upper
pole renal cyst which demonstrates a thin internal septation without
suspicious enhancing soft tissue nodularity or wall thickening,
consistent with a benign renal cyst. Additional 9 mm left upper pole
renal cyst.

Stomach/Bowel: Visualized portions within the abdomen are
unremarkable.

Vascular/Lymphatic: No pathologically enlarged lymph nodes
identified. No abdominal aortic aneurysm demonstrated.

Other:  No abdominal ascites.

Musculoskeletal: No suspicious bone lesions identified.
IMPRESSION: Benign left renal cysts including a 4.7 cm left upper pole renal
cyst which corresponds with the cystic lesion seen on prior
ultrasound. No solid enhancing renal mass.

## 2023-03-20 IMAGING — US US BIOPSY
1 series · 13 of 16 positions shown · non-contrast
Comparison: none

INDICATION: Worsening chronic kidney disease

[Series 1: us biopsy (kidney) · 13 of 16 slices shown]
[im 1/16]
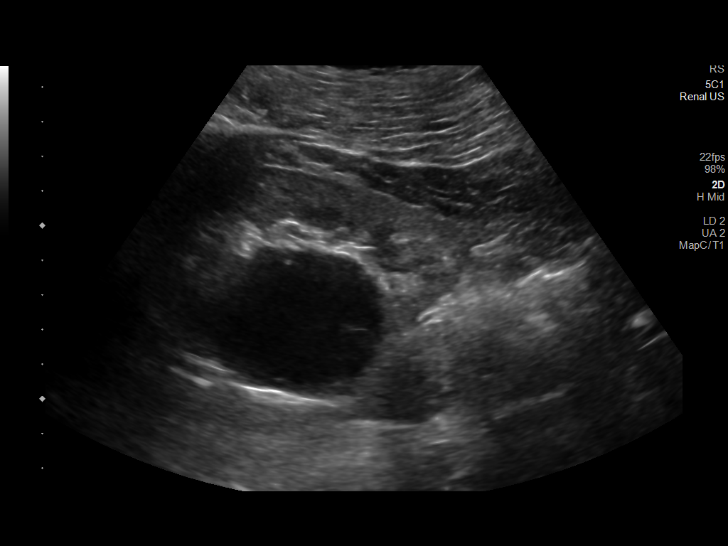
[im 2/16]
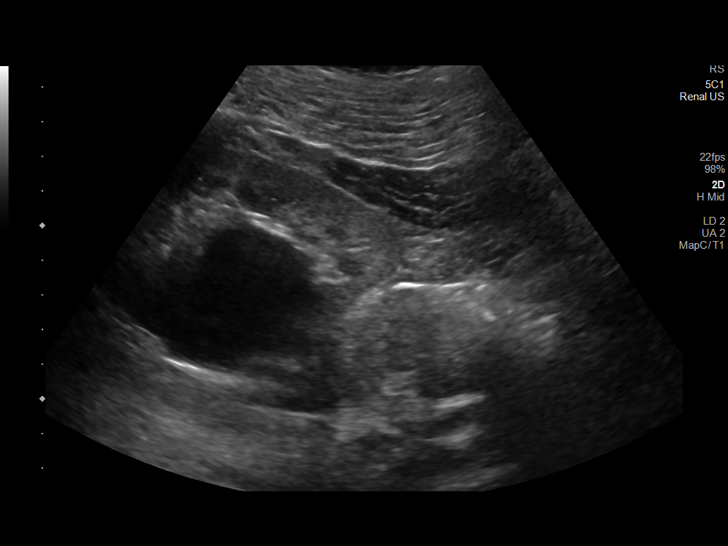
[im 4/16]
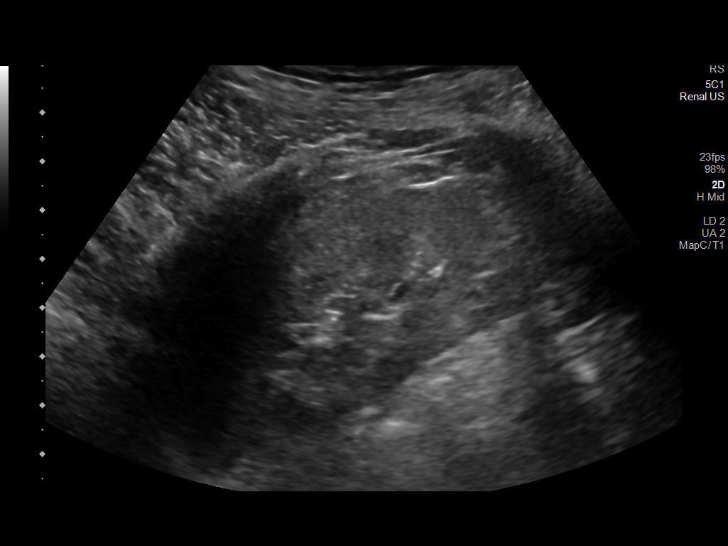
[im 5/16]
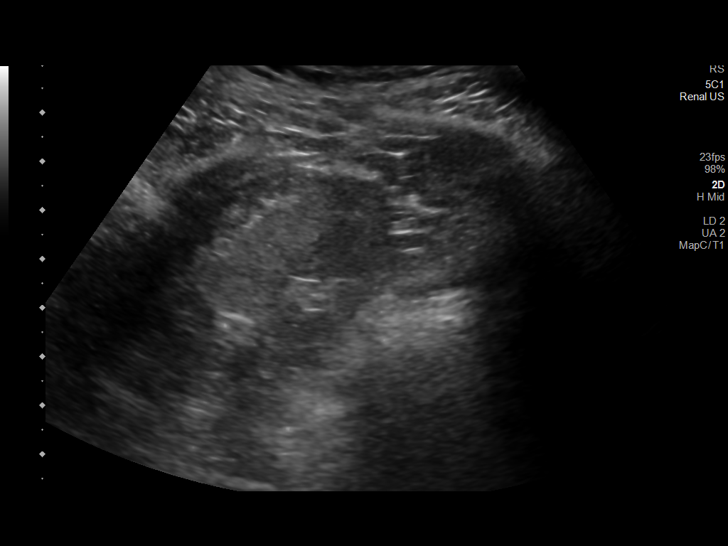
[im 6/16]
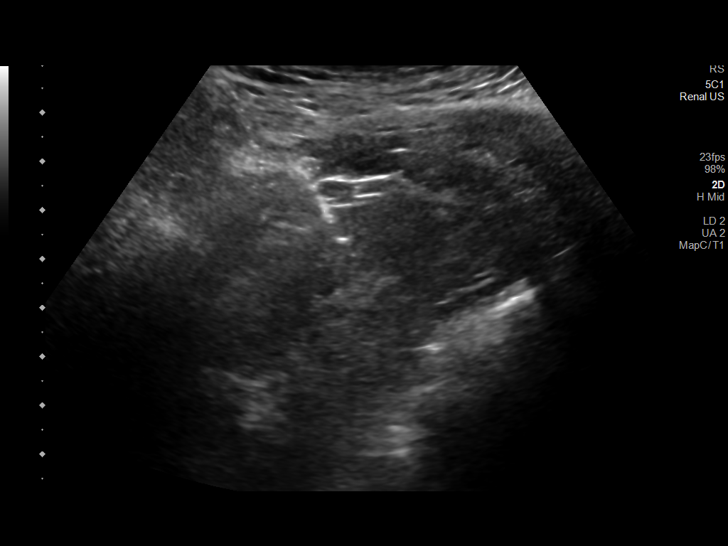
[im 7/16]
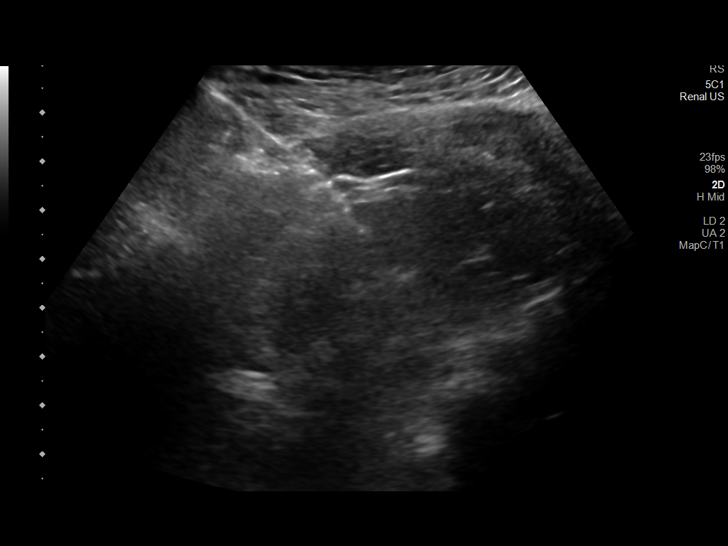
[im 9/16]
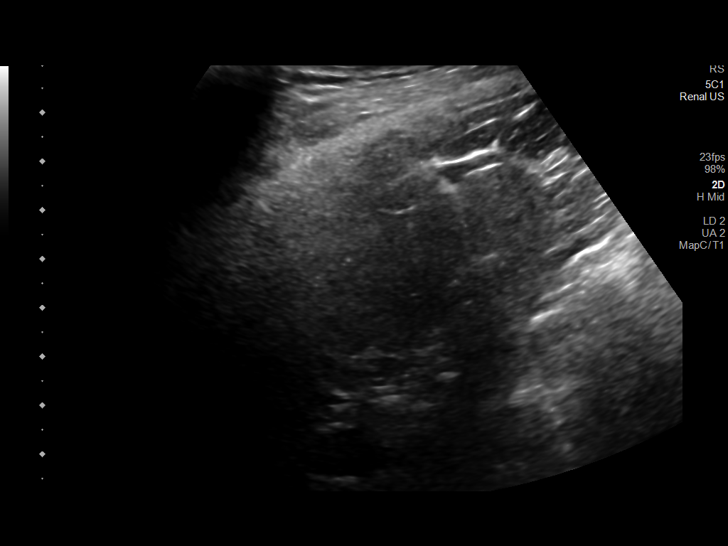
[im 10/16]
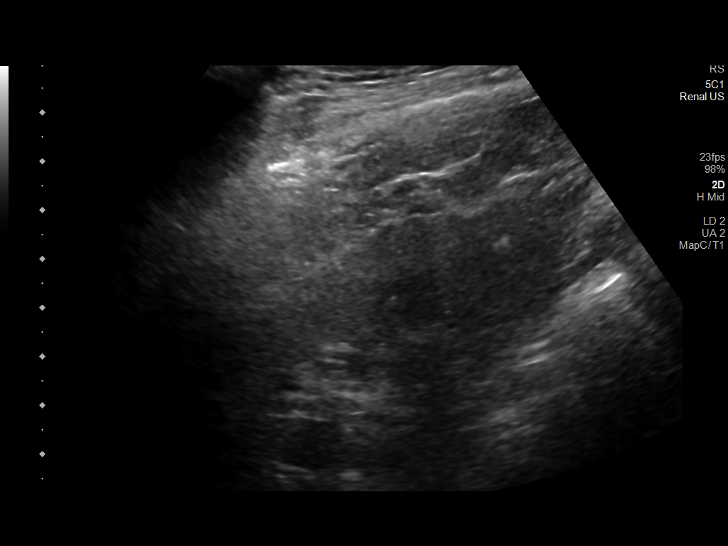
[im 11/16]
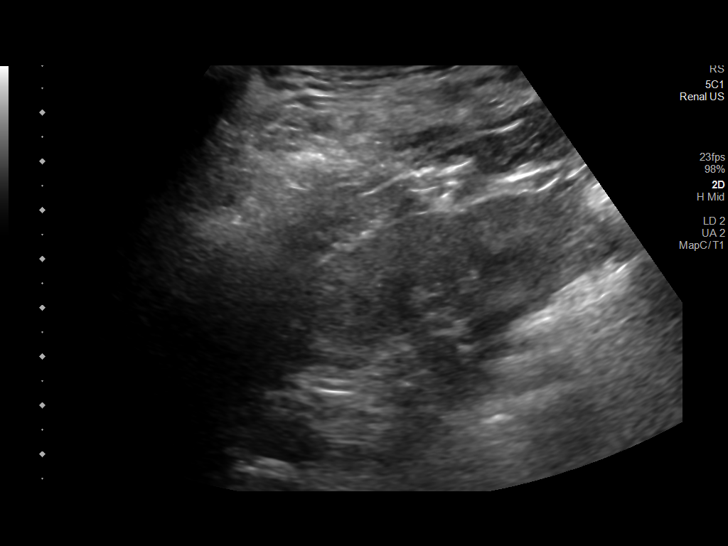
[im 12/16]
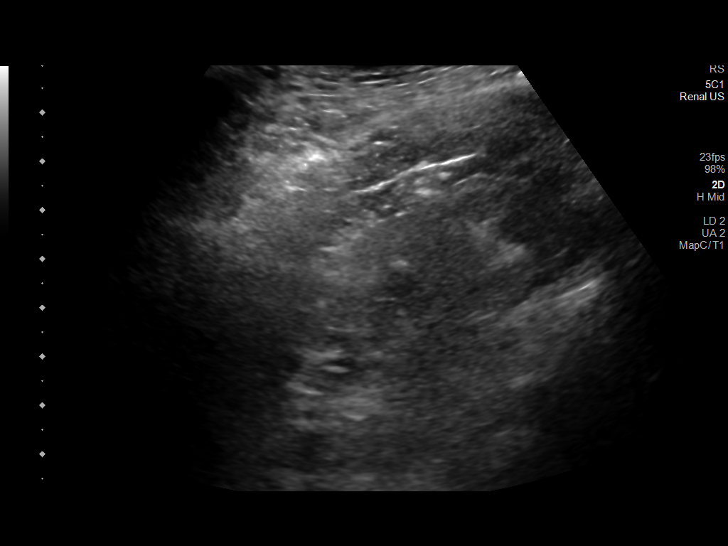
[im 13/16]
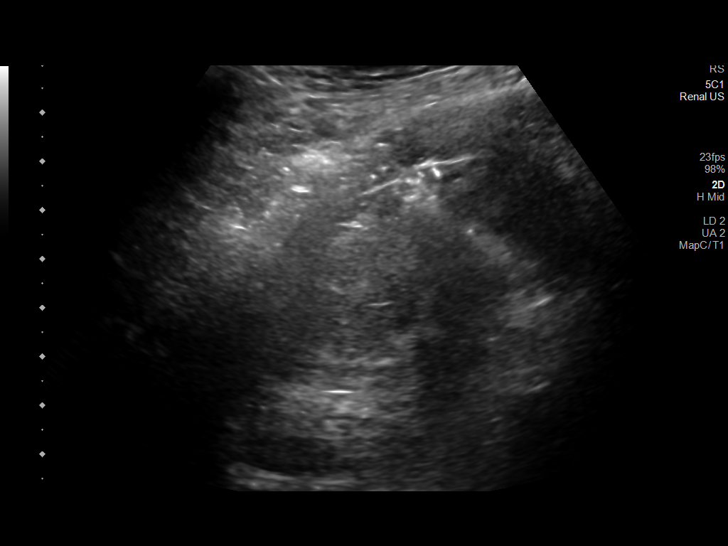
[im 15/16]
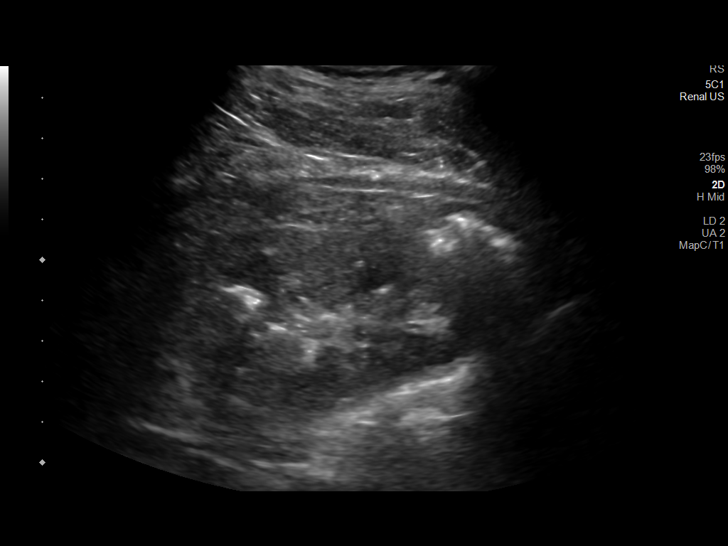
[im 16/16]
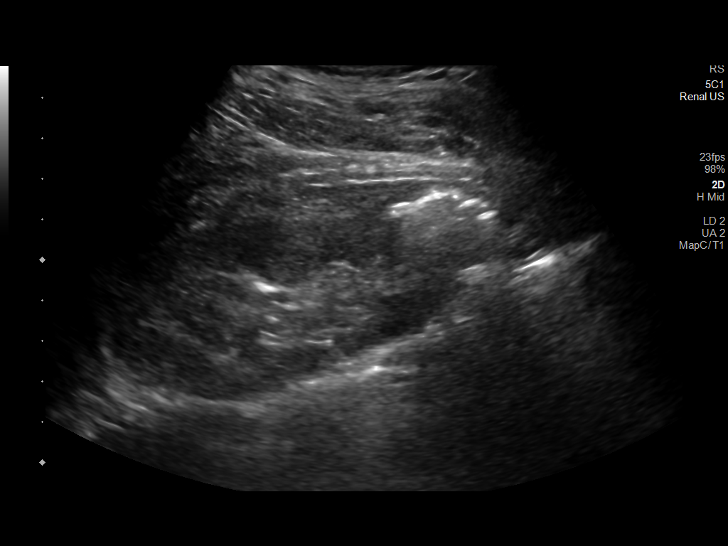

[13 of 16 positions shown; findings below may reference images not displayed]

EXAM:
Ultrasound-guided random renal biopsy

MEDICATIONS:
None.

ANESTHESIA/SEDATION:
Moderate (conscious) sedation was employed during this procedure. A
total of Versed 2 mg and Fentanyl 100 mcg was administered
intravenously by the radiology nurse.

Total intra-service moderate Sedation Time: 15 minutes. The
patient's level of consciousness and vital signs were monitored
continuously by radiology nursing throughout the procedure under my
direct supervision.

COMPLICATIONS:
None immediate.

PROCEDURE:
Informed written consent was obtained from the patient after a
thorough discussion of the procedural risks, benefits and
alternatives. All questions were addressed. Maximal Sterile Barrier
Technique was utilized including caps, mask, sterile gowns, sterile
gloves, sterile drape, hand hygiene and skin antiseptic. A timeout
was performed prior to the initiation of the procedure.

Patient position prone on the ultrasound table.

Right flank skin prepped and draped in usual sterile fashion.

Following local lidocaine administration, 15 gauge introducer needle
was advanced into the lower pole of the right kidney, and three 16
gauge cores were obtained utilizing continuous ultrasound guidance.

Gelfoam slurry was administered through the introducer needle at the
biopsy site.

Samples were sent to pathology in formalin.

Needle removed and hemostasis achieved with 5 minutes of manual
compression.

Post procedure ultrasound images showed no evidence of significant
hemorrhage.
IMPRESSION: Ultrasound-guided random right renal biopsy as above.

## 2023-05-05 ENCOUNTER — Ambulatory Visit (INDEPENDENT_AMBULATORY_CARE_PROVIDER_SITE_OTHER)

## 2023-05-05 ENCOUNTER — Ambulatory Visit (HOSPITAL_BASED_OUTPATIENT_CLINIC_OR_DEPARTMENT_OTHER): Admitting: Student

## 2023-05-05 DIAGNOSIS — S93401A Sprain of unspecified ligament of right ankle, initial encounter: Secondary | ICD-10-CM

## 2023-05-05 DIAGNOSIS — M25571 Pain in right ankle and joints of right foot: Secondary | ICD-10-CM

## 2023-05-05 NOTE — Progress Notes (Signed)
 Chief Complaint: Right ankle injury     History of Present Illness:    Jonathan Li is a 33 y.o. male presenting today for evaluation of a right ankle injury.  He reports that about 5 days ago he was playing basketball at the gym and jumped up to block a shot, ultimately landing on another player's foot.  His ankle inverted and he did feel a pop.  Since then, he has developed significant swelling and ecchymosis throughout the foot and ankle.  He has been icing as well as taking ibuprofen.  He had a similar injury to this ankle back in 2022 but ultimately healed well.  He had a walking boot from this prior injury, which he has been wearing over the past few days and is helping to allow him to ambulate and weight-bear for longer periods of time.   Surgical History:   None  PMH/PSH/Family History/Social History/Meds/Allergies:   No past medical history on file. No past surgical history on file. 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    Employer: [employer]  Tobacco Use   Smoking status: Never   Smokeless tobacco: Never  Vaping Use   Vaping status: Never Used  Substance and Sexual Activity   Alcohol use: Yes    Alcohol/week: 5.0 standard drinks of alcohol    Types: 5 Cans of beer per week    Comment: Socially    Drug use: Never   Sexual activity: Not Currently  Other Topics Concern   Not on file  Social History Narrative   Not on file   Social Drivers of Health   Financial Resource Strain: Low Risk  (09/11/2022)   Overall Financial Resource Strain (CARDIA)    Difficulty of Paying Living Expenses: Not very hard  Food Insecurity: No Food Insecurity (09/11/2022)   Hunger Vital Sign     Worried About Running Out of Food in the Last Year: Never true    Ran Out of Food in the Last Year: Never true  Transportation Needs: Unmet Transportation Needs (09/11/2022)   PRAPARE - Transportation    Lack of Transportation (Medical): Yes    Lack of Transportation (Non-Medical): Yes  Physical Activity: Sufficiently Active (09/11/2022)   Exercise Vital Sign    Days of Exercise per Week: 7 days    Minutes of Exercise per Session: 60 min  Stress: Stress Concern Present (09/11/2022)   [employer] of Occupational Health - Occupational Stress Questionnaire    Feeling of Stress : To some extent  Social Connections: Moderately Integrated (09/11/2022)   Social Connection and Isolation Panel [NHANES]    Frequency of Communication with Friends and Family: Twice a week    Frequency of Social Gatherings with Friends and Family: Twice a week    Attends Religious Services: 1 to 4 times per year    Active Member of [employer] or Organizations: Yes    Attends Engineer, structural: More than 4 times per year    Marital Status: Never married   Family History  Problem Relation Age of Onset   Hypertension Mother    Allergies  Allergen Reactions   Penicillins     Unknown reaction   Sulfa Antibiotics Hives   Amoxicillin Rash   Current Outpatient Medications  Medication Sig Dispense Refill   allopurinol  (ZYLOPRIM ) 100 MG tablet Take 1 tablet (100 mg total) by mouth daily. 30 tablet 5   influenza vac split trivalent PF (FLULAVAL) 0.5 ML injection Inject into the muscle. 0.5 mL 0   allopurinol  (ZYLOPRIM ) 100 MG tablet Take 1/2 tablet by mouth daily (Patient taking differently: Take 50 mg by mouth daily.) 15 tablet 3   influenza vac split quadrivalent PF (FLUARIX) 0.5 ML injection Inject into the muscle. 0.5 mL 0   ondansetron  (ZOFRAN ) 4 MG tablet Take 1 tablet (4 mg total) by mouth every 8 (eight) hours as needed for Nausea / Vomiting. 20 tablet 0   polyethylene glycol powder  (GLYCOLAX /MIRALAX ) 17 GM/SCOOP powder Take 17 g by mouth daily for 7 days. 238 g 0   traMADol  (ULTRAM ) 50 MG tablet Take 1 tablet (50 mg total) by mouth every 6 (six) hours as needed for up to 5 days after surgery. 25 tablet 0   No current facility-administered medications for this visit.   No results found.  Review of Systems:   A ROS was performed including pertinent positives and negatives as documented in the HPI.  Physical Exam :   Constitutional: NAD and appears stated age Neurological: Alert and oriented Psych: Appropriate affect and cooperative There were no vitals taken for this visit.   Comprehensive Musculoskeletal Exam:    Right ankle exam demonstrates diffuse, moderate swelling and ecchymosis that extends from the toes up into the right lower leg superior to the ankle mortise.  Active ROM to 20 degrees dorsiflexion and plantarflexion.  Positive anterior drawer test.  PT and DP pulses are 2+.  Distal neurosensory exam intact.  Imaging:   Xray (right foot 3 views): No evidence of acute fracture or dislocation.  Soft tissue edema noted within the ankle.   I personally reviewed and interpreted the radiographs.   Assessment:   33 y.o. male with an acute inversion injury to his right ankle 5 days ago.  He does have a history of 1 significant sprain to this ankle back in 2022.  X-rays taken today did not show any evidence of acute bony abnormality, however I did discuss that only foot x-rays were obtained and not dedicated ankle x-rays.  I have offered for him to return to clinic at any point as he does work downstairs to have dedicated ankle x-rays taken to ensure there is no fracture or disruption to the ankle joint.  Will have him continue to weight-bear as tolerated with use of the boot as needed and can transition into an ankle brace once tolerated.  Recommend continued RICE therapy and NSAIDs for pain and inflammation.  Plan :    - Return to clinic as needed     I  personally saw and evaluated the patient, and participated in the management and treatment plan.  Sharrell Deck, PA-C Orthopedics

## 2023-10-21 ENCOUNTER — Other Ambulatory Visit (HOSPITAL_BASED_OUTPATIENT_CLINIC_OR_DEPARTMENT_OTHER): Payer: Self-pay

## 2023-10-21 MED ORDER — FLUZONE 0.5 ML IM SUSY
0.5000 mL | PREFILLED_SYRINGE | Freq: Once | INTRAMUSCULAR | 0 refills | Status: AC
  Filled 2023-10-21: qty 0.5, 1d supply, fill #0

## 2023-11-16 ENCOUNTER — Encounter: Payer: Self-pay | Admitting: Radiology
